# Patient Record
Sex: Male | Born: 1990 | Hispanic: Yes | Marital: Single | Smoking: Never smoker
Health system: Southern US, Community
[De-identification: ages and names within clinical notes are randomized; demographics above are authoritative.]

## PROBLEM LIST (undated history)

## (undated) DIAGNOSIS — K219 Gastro-esophageal reflux disease without esophagitis: Secondary | ICD-10-CM

## (undated) DIAGNOSIS — E079 Disorder of thyroid, unspecified: Secondary | ICD-10-CM

## (undated) HISTORY — DX: Gastro-esophageal reflux disease without esophagitis: K21.9

## (undated) HISTORY — DX: Disorder of thyroid, unspecified: E07.9

## (undated) HISTORY — PX: ESOPHAGOGASTRODUODENOSCOPY: SHX1529

---

## 2012-07-22 DIAGNOSIS — L709 Acne, unspecified: Secondary | ICD-10-CM | POA: Insufficient documentation

## 2013-03-23 ENCOUNTER — Ambulatory Visit: Payer: Self-pay | Admitting: Endocrinology

## 2013-08-02 ENCOUNTER — Ambulatory Visit: Payer: Self-pay | Admitting: Endocrinology

## 2013-09-05 ENCOUNTER — Ambulatory Visit (INDEPENDENT_AMBULATORY_CARE_PROVIDER_SITE_OTHER): Payer: Self-pay | Admitting: Endocrinology

## 2013-09-05 ENCOUNTER — Encounter: Payer: Self-pay | Admitting: Endocrinology

## 2013-09-05 ENCOUNTER — Ambulatory Visit (INDEPENDENT_AMBULATORY_CARE_PROVIDER_SITE_OTHER): Payer: BC Managed Care – PPO | Admitting: Endocrinology

## 2013-09-05 VITALS — BP 92/50 | HR 60 | Temp 98.1°F | Resp 10 | Ht 64.0 in | Wt 110.3 lb

## 2013-09-05 DIAGNOSIS — E039 Hypothyroidism, unspecified: Secondary | ICD-10-CM | POA: Insufficient documentation

## 2013-09-05 LAB — TSH: TSH: 0.65 u[IU]/mL (ref 0.35–5.50)

## 2013-09-05 LAB — T4, FREE: Free T4: 0.99 ng/dL (ref 0.60–1.60)

## 2013-09-05 NOTE — Patient Instructions (Signed)
blood tests are being requested for you today.  We'll contact you with results.  

## 2013-09-05 NOTE — Progress Notes (Signed)
  Subjective:    Patient ID: Craig Price, male    DOB: 03/22/1991, 22 y.o.   MRN: [id number]  HPI In December of 2013, pt was noted on routine labs to have abnormal TSH (16).  He was rx'ed synthroid 25 mcg/day, and has been on this dosage ever since then.  He has moderate cold intolerance throughout the body, and assoc fatigue.   No past medical history on file.  No past surgical history on file.  History   Social History  . Marital Status: Single    Spouse Name: N/A    Number of Children: N/A  . Years of Education: N/A   Occupational History  . Not on file.   Social History Main Topics  . Smoking status: Not on file  . Smokeless tobacco: Not on file  . Alcohol Use: Not on file  . Drug Use: Not on file  . Sexual Activity: Not on file   Other Topics Concern  . Not on file   Social History Narrative  . No narrative on file    No current outpatient prescriptions on file prior to visit.   No current facility-administered medications on file prior to visit.    Allergies not on file  No family history on file. No thyroid problems There were no vitals taken for this visit.  Review of Systems denies depression, hair loss, cramps, sob, weight gain, numbness, myalgias, dry skin, rhinorrhea, easy bruising, and syncope.  He has constipation, acne, and blurry vision.  In 2010, he had a 1 cm area of hair loss at the back of the head.  It resolved without rx.      Objective:   Physical Exam VS: see vs page GEN: no distress HEAD: head: no deformity eyes: no periorbital swelling, no proptosis external nose and ears are normal mouth: no lesion seen NECK: supple, thyroid is not enlarged CHEST WALL: no deformity LUNGS: clear to auscultation BREASTS:  No gynecomastia CV: reg rate and rhythm, no murmur ABD: abdomen is soft, nontender.  no hepatosplenomegaly.  not distended.  no hernia MUSCULOSKELETAL: muscle bulk and strength are grossly normal.  no obvious joint swelling.   gait is normal and steady EXTEMITIES: no deformity. no edema PULSES: dorsalis pedis intact bilat.  no carotid bruit NEURO:  cn 2-12 grossly intact.   readily moves all 4's.  sensation is intact to touch on all 4's.  No tremor SKIN:  Normal texture and temperature.  No rash or suspicious lesion is visible.   NODES:  None palpable at the neck PSYCH: alert, oriented x3.  Does not appear anxious nor depressed.  Lab Results  Component Value Date   TSH 0.65 09/05/2013      Assessment & Plan:  Chronic hypothyroidism, well-replaced Alopecia arreata, prob autoimmune, resolved without rx. Cold intolerance, and other sxs, not thyroid-related.

## 2013-09-12 DIAGNOSIS — E039 Hypothyroidism, unspecified: Secondary | ICD-10-CM | POA: Insufficient documentation

## 2013-09-12 NOTE — Progress Notes (Signed)
   Subjective:    Patient ID: Craig Price, male    DOB: 12-Dec-1990, 22 y.o.   MRN: [id number]  HPI    Review of Systems     Objective:   Physical Exam        Assessment & Plan:

## 2015-11-20 ENCOUNTER — Other Ambulatory Visit: Payer: Self-pay | Admitting: Gastroenterology

## 2015-11-20 DIAGNOSIS — IMO0001 Reserved for inherently not codable concepts without codable children: Secondary | ICD-10-CM

## 2015-11-20 DIAGNOSIS — R1310 Dysphagia, unspecified: Secondary | ICD-10-CM

## 2015-11-20 DIAGNOSIS — K219 Gastro-esophageal reflux disease without esophagitis: Principal | ICD-10-CM

## 2015-11-26 ENCOUNTER — Ambulatory Visit
Admission: RE | Admit: 2015-11-26 | Discharge: 2015-11-26 | Disposition: A | Discharge status: Home / Self Care | Payer: Managed Care, Other (non HMO) | Source: Ambulatory Visit | Attending: Gastroenterology | Admitting: Gastroenterology

## 2015-11-26 ENCOUNTER — Other Ambulatory Visit: Payer: Self-pay

## 2015-11-26 DIAGNOSIS — K219 Gastro-esophageal reflux disease without esophagitis: Principal | ICD-10-CM

## 2015-11-26 DIAGNOSIS — R1310 Dysphagia, unspecified: Secondary | ICD-10-CM

## 2015-11-26 DIAGNOSIS — IMO0001 Reserved for inherently not codable concepts without codable children: Secondary | ICD-10-CM

## 2016-01-31 ENCOUNTER — Other Ambulatory Visit: Payer: Self-pay | Admitting: Internal Medicine

## 2016-01-31 DIAGNOSIS — R51 Headache: Principal | ICD-10-CM

## 2016-01-31 DIAGNOSIS — R519 Headache, unspecified: Secondary | ICD-10-CM

## 2016-02-01 ENCOUNTER — Ambulatory Visit
Admission: RE | Admit: 2016-02-01 | Discharge: 2016-02-01 | Disposition: A | Discharge status: Home / Self Care | Payer: Managed Care, Other (non HMO) | Source: Ambulatory Visit | Attending: Internal Medicine | Admitting: Internal Medicine

## 2016-02-01 DIAGNOSIS — R51 Headache: Principal | ICD-10-CM

## 2016-02-01 DIAGNOSIS — R519 Headache, unspecified: Secondary | ICD-10-CM

## 2016-02-06 ENCOUNTER — Other Ambulatory Visit: Payer: Managed Care, Other (non HMO)

## 2016-03-04 DIAGNOSIS — K219 Gastro-esophageal reflux disease without esophagitis: Secondary | ICD-10-CM | POA: Insufficient documentation

## 2016-03-04 DIAGNOSIS — R490 Dysphonia: Secondary | ICD-10-CM | POA: Insufficient documentation

## 2016-03-04 DIAGNOSIS — R059 Cough, unspecified: Secondary | ICD-10-CM | POA: Insufficient documentation

## 2016-08-07 DIAGNOSIS — J029 Acute pharyngitis, unspecified: Secondary | ICD-10-CM | POA: Insufficient documentation

## 2016-08-07 DIAGNOSIS — H9203 Otalgia, bilateral: Secondary | ICD-10-CM | POA: Insufficient documentation

## 2016-08-19 ENCOUNTER — Other Ambulatory Visit: Payer: Self-pay | Admitting: Internal Medicine

## 2016-08-19 DIAGNOSIS — R490 Dysphonia: Secondary | ICD-10-CM

## 2016-08-19 DIAGNOSIS — M542 Cervicalgia: Secondary | ICD-10-CM

## 2016-08-21 ENCOUNTER — Other Ambulatory Visit: Payer: Managed Care, Other (non HMO)

## 2016-08-26 ENCOUNTER — Inpatient Hospital Stay: Admission: RE | Admit: 2016-08-26 | Payer: Managed Care, Other (non HMO) | Source: Ambulatory Visit

## 2016-09-12 ENCOUNTER — Other Ambulatory Visit: Payer: Managed Care, Other (non HMO)

## 2016-10-17 ENCOUNTER — Ambulatory Visit
Admission: RE | Admit: 2016-10-17 | Discharge: 2016-10-17 | Disposition: A | Discharge status: Home / Self Care | Payer: Managed Care, Other (non HMO) | Source: Ambulatory Visit | Attending: Internal Medicine | Admitting: Internal Medicine

## 2016-10-17 DIAGNOSIS — R490 Dysphonia: Secondary | ICD-10-CM

## 2016-10-17 DIAGNOSIS — M542 Cervicalgia: Secondary | ICD-10-CM

## 2016-10-17 MED ORDER — IOPAMIDOL (ISOVUE-300) INJECTION 61%
75.0000 mL | Freq: Once | INTRAVENOUS | Status: AC | PRN
  Administered 2016-10-17: 75 mL via INTRAVENOUS

## 2017-11-27 ENCOUNTER — Other Ambulatory Visit: Payer: Self-pay | Admitting: Gastroenterology

## 2017-11-27 DIAGNOSIS — R634 Abnormal weight loss: Secondary | ICD-10-CM

## 2017-11-27 DIAGNOSIS — R1012 Left upper quadrant pain: Secondary | ICD-10-CM

## 2017-12-18 ENCOUNTER — Ambulatory Visit
Admission: RE | Admit: 2017-12-18 | Discharge: 2017-12-18 | Disposition: A | Discharge status: Home / Self Care | Payer: Managed Care, Other (non HMO) | Source: Ambulatory Visit | Attending: Gastroenterology | Admitting: Gastroenterology

## 2017-12-18 DIAGNOSIS — R1012 Left upper quadrant pain: Secondary | ICD-10-CM

## 2017-12-18 DIAGNOSIS — R634 Abnormal weight loss: Secondary | ICD-10-CM

## 2017-12-18 IMAGING — CT CT ABD-PELV W/ CM
1 of 2 series · 14 of 32 positions shown, 19 images · IV contrast (APPLIED)
Comparison: None.

CLINICAL DATA: Left upper quadrant pain for 2 months.  Weight loss.

EXAM:
CT ABDOMEN AND PELVIS WITH CONTRAST
TECHNIQUE: Multidetector CT imaging of the abdomen and pelvis was performed
using the standard protocol following bolus administration of
intravenous contrast.
CONTRAST:  100mL [BC] IOPAMIDOL ([BC]) INJECTION 61%

[Series 2: abd/pelvis w/cm · axial · 0.66mm/px · z∈[+607,+997]mm · 14 of 88 slices shown, 19 images]
[im 5/88  soft-tissue]
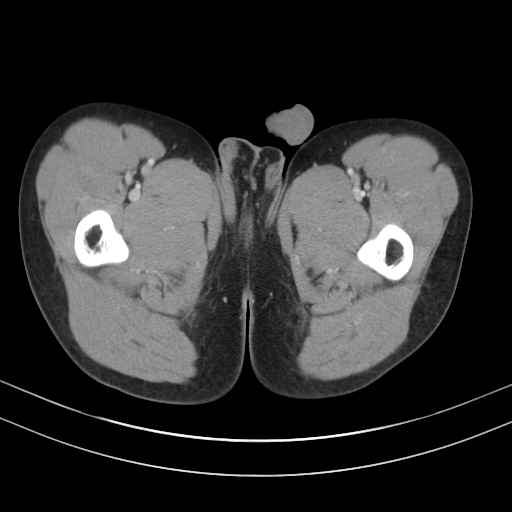
[im 5/88  bone]
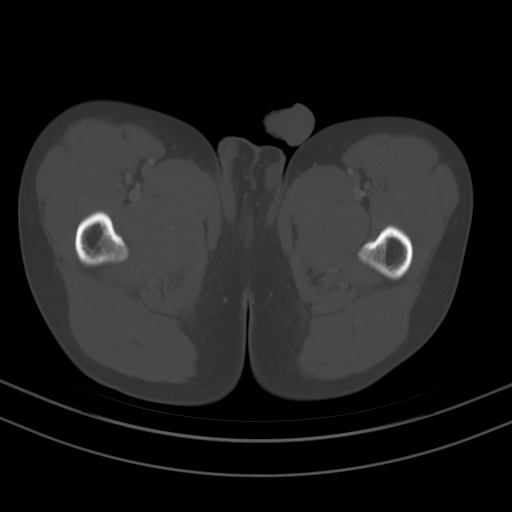
[im 14/88  soft-tissue]
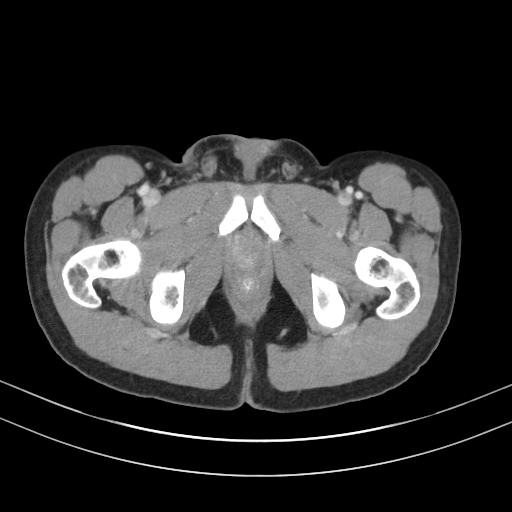
[im 19/88  soft-tissue]
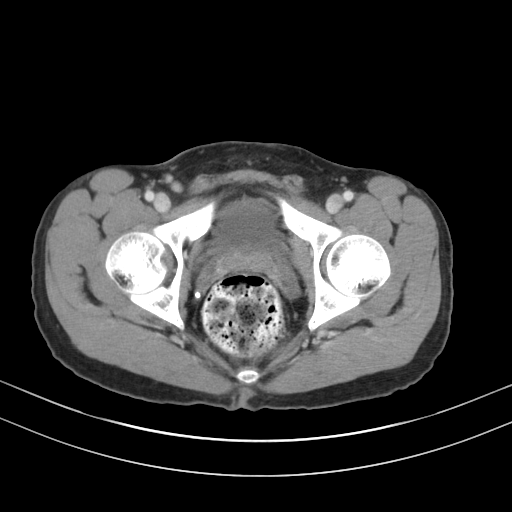
[im 23/88  soft-tissue]
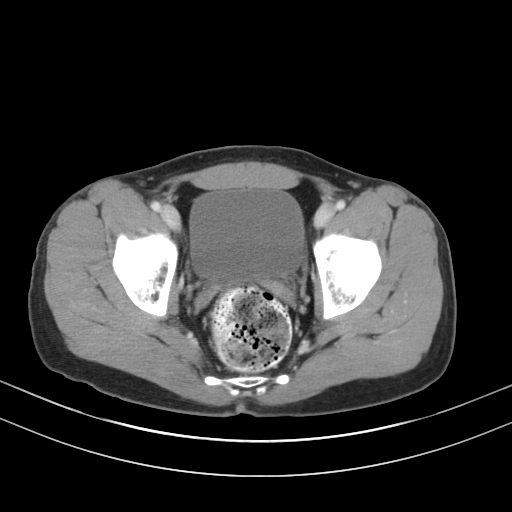
[im 33/88  soft-tissue]
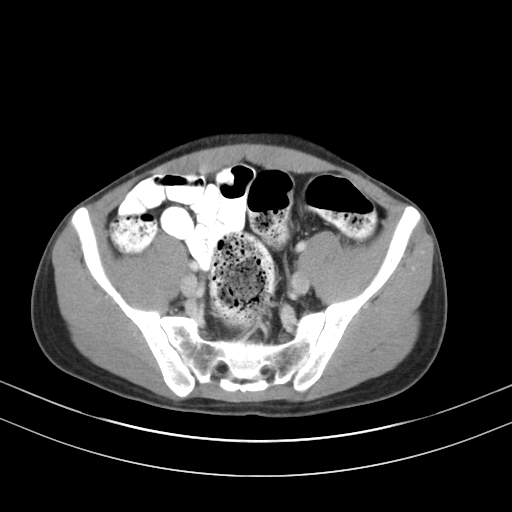
[im 37/88  soft-tissue]
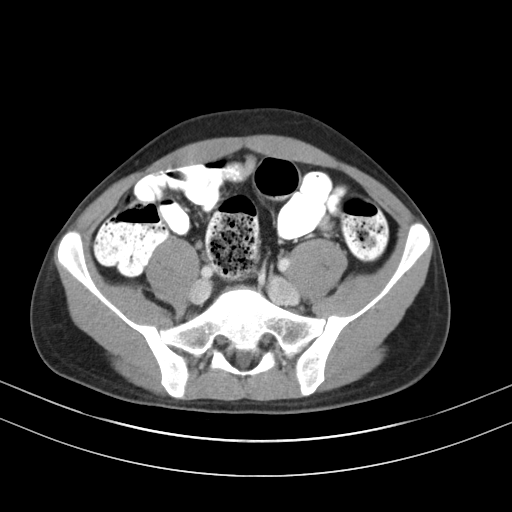
[im 46/88  soft-tissue]
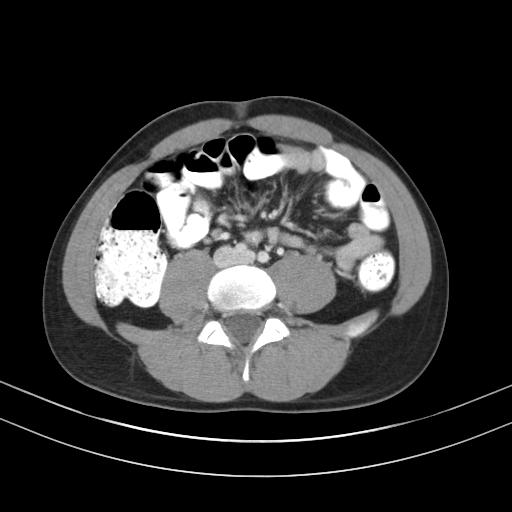
[im 51/88  soft-tissue]
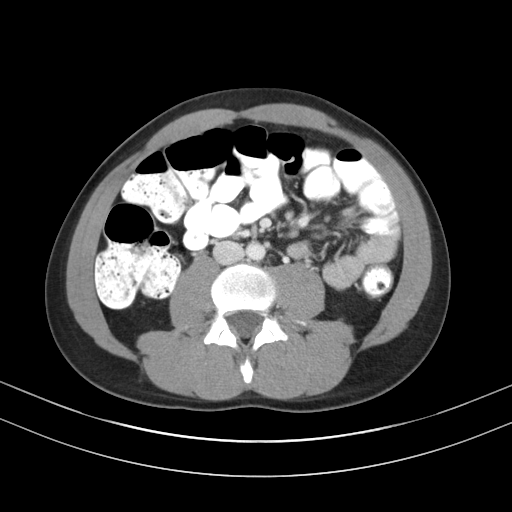
[im 55/88  soft-tissue]
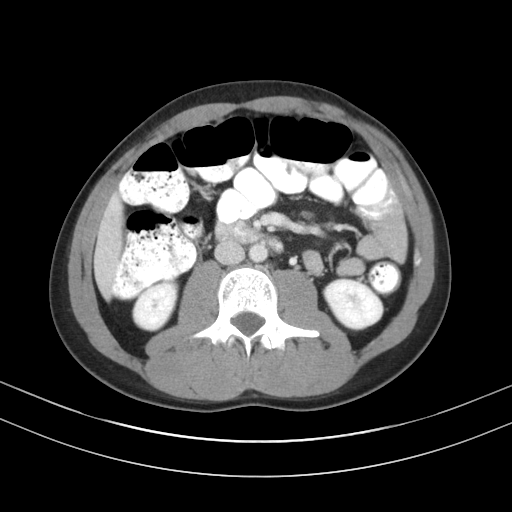
[im 55/88  bone]
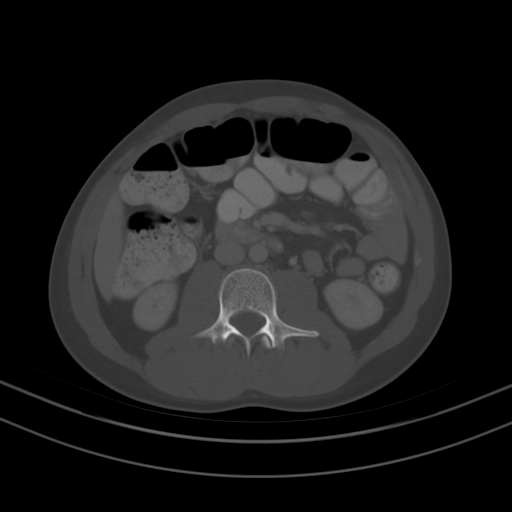
[im 65/88  soft-tissue]
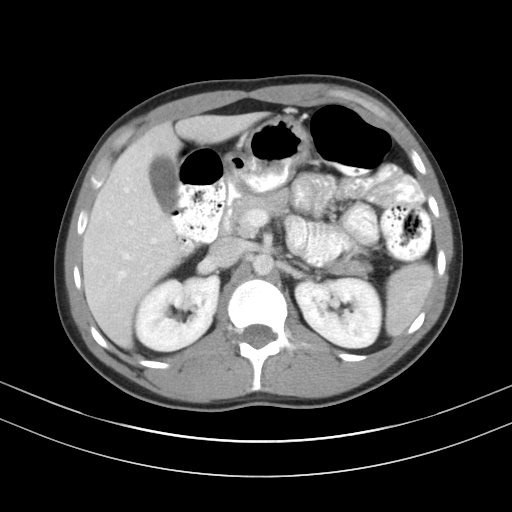
[im 69/88  soft-tissue]
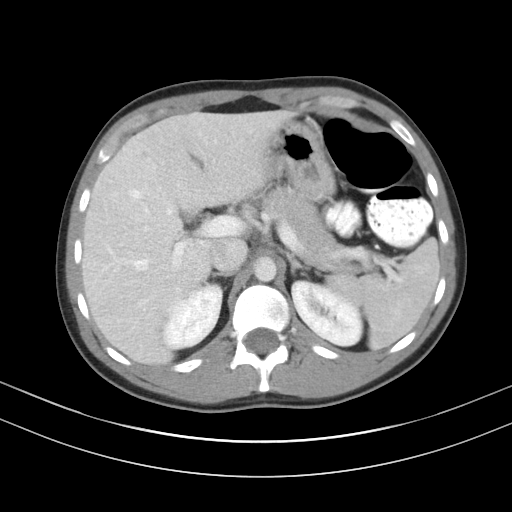
[im 69/88  lung]
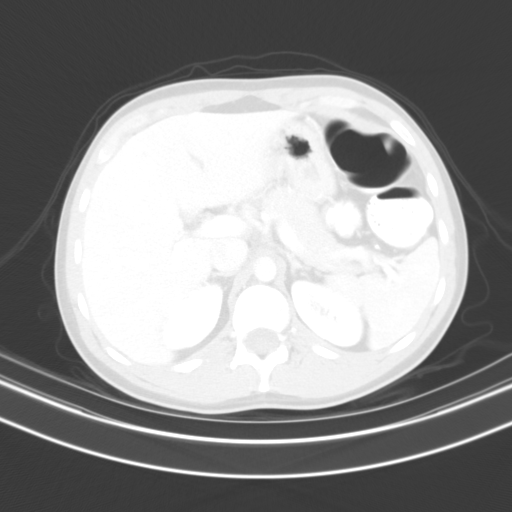
[im 74/88  soft-tissue]
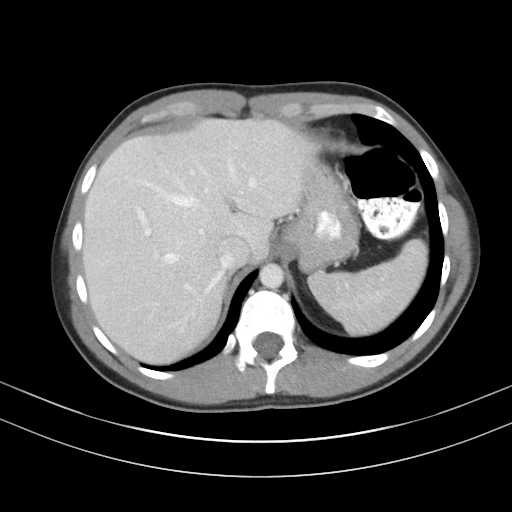
[im 74/88  lung]
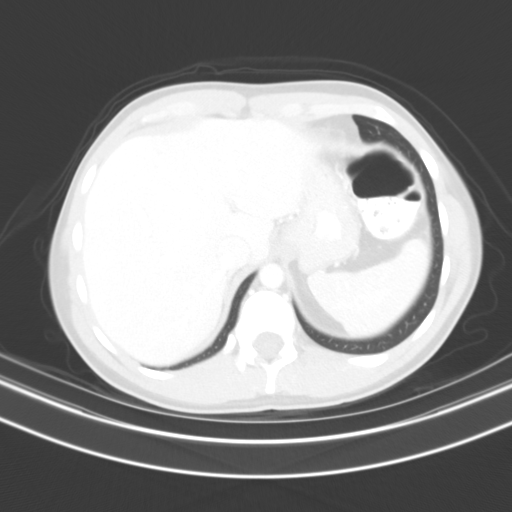
[im 78/88  lung]
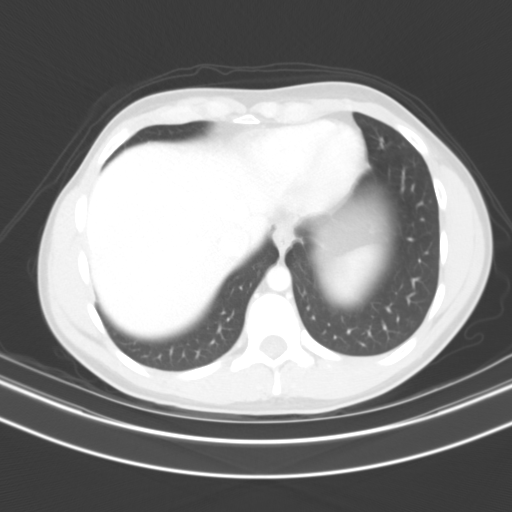
[im 83/88  soft-tissue]
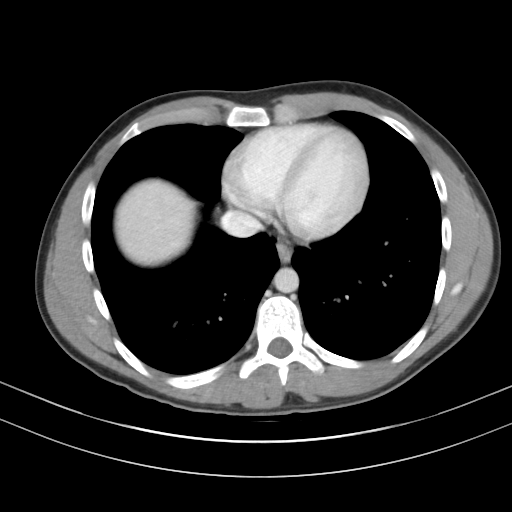
[im 83/88  lung]
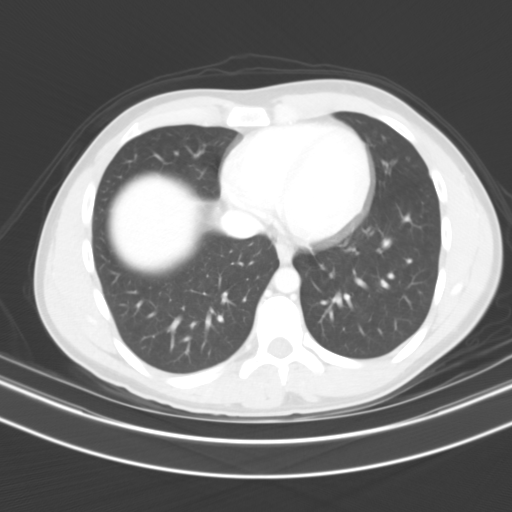

[14 of 32 positions shown; findings below may reference images not displayed]

FINDINGS: Lower Chest: No acute findings.

Hepatobiliary: No hepatic masses identified. Gallbladder is
unremarkable.

Pancreas:  No mass or inflammatory changes.

Spleen: Within normal limits in size and appearance.

Adrenals/Urinary Tract: No masses identified. No evidence of
hydronephrosis. Unremarkable unopacified urinary bladder.

Stomach/Bowel: No evidence of obstruction, inflammatory process or
abnormal fluid collections. Normal appendix visualized. Large amount
of stool seen throughout the colon.

Vascular/Lymphatic: No pathologically enlarged lymph nodes. No
abdominal aortic aneurysm.

Reproductive: Rim enhancing low-attenuation lesion is seen in the
left apical peripheral zone of the prostate gland, measuring
approximately 1.5 cm. In a patient of this age, this raises
suspicion for prostatitis with small prostatic abscess.

Other:  None.

Musculoskeletal:  No suspicious bone lesions identified.
IMPRESSION: Small peripherally enhancing lesion in the prostate, suspicious for
prostatitis with small prostatic abscess in patient of this age.
Consider correlation with PSA; prostate MRI without and with
contrast could also be performed for further evaluation.

Large stool burden noted; recommend clinical correlation for
possible constipation.

## 2017-12-18 MED ORDER — IOPAMIDOL (ISOVUE-300) INJECTION 61%
100.0000 mL | Freq: Once | INTRAVENOUS | Status: AC | PRN
  Administered 2017-12-18: 100 mL via INTRAVENOUS

## 2018-01-01 ENCOUNTER — Other Ambulatory Visit: Payer: Self-pay | Admitting: Urology

## 2018-01-01 DIAGNOSIS — N410 Acute prostatitis: Secondary | ICD-10-CM

## 2018-01-01 DIAGNOSIS — N4283 Cyst of prostate: Secondary | ICD-10-CM

## 2018-01-27 ENCOUNTER — Ambulatory Visit (HOSPITAL_COMMUNITY): Payer: Managed Care, Other (non HMO)

## 2018-01-29 ENCOUNTER — Ambulatory Visit (HOSPITAL_COMMUNITY): Payer: Managed Care, Other (non HMO)

## 2019-02-07 DIAGNOSIS — R634 Abnormal weight loss: Secondary | ICD-10-CM | POA: Diagnosis not present

## 2019-02-07 DIAGNOSIS — R1012 Left upper quadrant pain: Secondary | ICD-10-CM | POA: Diagnosis not present

## 2019-02-07 DIAGNOSIS — K219 Gastro-esophageal reflux disease without esophagitis: Secondary | ICD-10-CM | POA: Diagnosis not present

## 2019-03-28 DIAGNOSIS — K269 Duodenal ulcer, unspecified as acute or chronic, without hemorrhage or perforation: Secondary | ICD-10-CM | POA: Diagnosis not present

## 2019-03-28 DIAGNOSIS — R1013 Epigastric pain: Secondary | ICD-10-CM | POA: Diagnosis not present

## 2019-03-28 DIAGNOSIS — K2970 Gastritis, unspecified, without bleeding: Secondary | ICD-10-CM | POA: Diagnosis not present

## 2019-03-28 DIAGNOSIS — K2980 Duodenitis without bleeding: Secondary | ICD-10-CM | POA: Diagnosis not present

## 2019-03-28 DIAGNOSIS — K2930 Chronic superficial gastritis without bleeding: Secondary | ICD-10-CM | POA: Diagnosis not present

## 2019-05-02 DIAGNOSIS — E038 Other specified hypothyroidism: Secondary | ICD-10-CM | POA: Diagnosis not present

## 2019-05-02 DIAGNOSIS — R634 Abnormal weight loss: Secondary | ICD-10-CM | POA: Diagnosis not present

## 2019-05-02 DIAGNOSIS — K219 Gastro-esophageal reflux disease without esophagitis: Secondary | ICD-10-CM | POA: Diagnosis not present

## 2019-05-02 DIAGNOSIS — E559 Vitamin D deficiency, unspecified: Secondary | ICD-10-CM | POA: Diagnosis not present

## 2019-05-02 DIAGNOSIS — E069 Thyroiditis, unspecified: Secondary | ICD-10-CM | POA: Diagnosis not present

## 2019-05-02 DIAGNOSIS — R5383 Other fatigue: Secondary | ICD-10-CM | POA: Diagnosis not present

## 2019-06-24 DIAGNOSIS — R82998 Other abnormal findings in urine: Secondary | ICD-10-CM | POA: Diagnosis not present

## 2019-06-24 DIAGNOSIS — E039 Hypothyroidism, unspecified: Secondary | ICD-10-CM | POA: Diagnosis not present

## 2019-06-24 DIAGNOSIS — Z0000 Encounter for general adult medical examination without abnormal findings: Secondary | ICD-10-CM | POA: Diagnosis not present

## 2019-06-24 DIAGNOSIS — E559 Vitamin D deficiency, unspecified: Secondary | ICD-10-CM | POA: Diagnosis not present

## 2019-06-28 DIAGNOSIS — E559 Vitamin D deficiency, unspecified: Secondary | ICD-10-CM | POA: Diagnosis not present

## 2019-06-28 DIAGNOSIS — M549 Dorsalgia, unspecified: Secondary | ICD-10-CM | POA: Diagnosis not present

## 2019-06-28 DIAGNOSIS — K219 Gastro-esophageal reflux disease without esophagitis: Secondary | ICD-10-CM | POA: Diagnosis not present

## 2019-06-28 DIAGNOSIS — K2980 Duodenitis without bleeding: Secondary | ICD-10-CM | POA: Diagnosis not present

## 2019-06-28 DIAGNOSIS — Z0000 Encounter for general adult medical examination without abnormal findings: Secondary | ICD-10-CM | POA: Diagnosis not present

## 2019-06-28 DIAGNOSIS — K2970 Gastritis, unspecified, without bleeding: Secondary | ICD-10-CM | POA: Diagnosis not present

## 2019-10-11 DIAGNOSIS — L700 Acne vulgaris: Secondary | ICD-10-CM | POA: Diagnosis not present

## 2019-10-11 DIAGNOSIS — L720 Epidermal cyst: Secondary | ICD-10-CM | POA: Diagnosis not present

## 2019-11-21 DIAGNOSIS — K219 Gastro-esophageal reflux disease without esophagitis: Secondary | ICD-10-CM | POA: Diagnosis not present

## 2020-01-05 DIAGNOSIS — L700 Acne vulgaris: Secondary | ICD-10-CM | POA: Diagnosis not present

## 2020-01-05 DIAGNOSIS — L738 Other specified follicular disorders: Secondary | ICD-10-CM | POA: Diagnosis not present

## 2020-07-06 DIAGNOSIS — E559 Vitamin D deficiency, unspecified: Secondary | ICD-10-CM | POA: Diagnosis not present

## 2020-07-06 DIAGNOSIS — Z0000 Encounter for general adult medical examination without abnormal findings: Secondary | ICD-10-CM | POA: Diagnosis not present

## 2020-07-06 DIAGNOSIS — E039 Hypothyroidism, unspecified: Secondary | ICD-10-CM | POA: Diagnosis not present

## 2020-07-13 DIAGNOSIS — Z0000 Encounter for general adult medical examination without abnormal findings: Secondary | ICD-10-CM | POA: Diagnosis not present

## 2020-07-13 DIAGNOSIS — Z7189 Other specified counseling: Secondary | ICD-10-CM | POA: Diagnosis not present

## 2020-07-13 DIAGNOSIS — R82998 Other abnormal findings in urine: Secondary | ICD-10-CM | POA: Diagnosis not present

## 2020-11-12 DIAGNOSIS — L700 Acne vulgaris: Secondary | ICD-10-CM | POA: Diagnosis not present

## 2020-11-12 DIAGNOSIS — L720 Epidermal cyst: Secondary | ICD-10-CM | POA: Diagnosis not present

## 2020-11-23 DIAGNOSIS — L720 Epidermal cyst: Secondary | ICD-10-CM | POA: Diagnosis not present

## 2020-11-23 DIAGNOSIS — L700 Acne vulgaris: Secondary | ICD-10-CM | POA: Diagnosis not present

## 2021-02-11 DIAGNOSIS — L658 Other specified nonscarring hair loss: Secondary | ICD-10-CM | POA: Diagnosis not present

## 2021-02-11 DIAGNOSIS — L700 Acne vulgaris: Secondary | ICD-10-CM | POA: Diagnosis not present

## 2021-07-26 DIAGNOSIS — E039 Hypothyroidism, unspecified: Secondary | ICD-10-CM | POA: Diagnosis not present

## 2021-07-26 DIAGNOSIS — E559 Vitamin D deficiency, unspecified: Secondary | ICD-10-CM | POA: Diagnosis not present

## 2021-08-01 DIAGNOSIS — Z1339 Encounter for screening examination for other mental health and behavioral disorders: Secondary | ICD-10-CM | POA: Diagnosis not present

## 2021-08-01 DIAGNOSIS — E039 Hypothyroidism, unspecified: Secondary | ICD-10-CM | POA: Diagnosis not present

## 2021-08-01 DIAGNOSIS — Z1331 Encounter for screening for depression: Secondary | ICD-10-CM | POA: Diagnosis not present

## 2021-08-01 DIAGNOSIS — Z0000 Encounter for general adult medical examination without abnormal findings: Secondary | ICD-10-CM | POA: Diagnosis not present

## 2022-03-03 DIAGNOSIS — E039 Hypothyroidism, unspecified: Secondary | ICD-10-CM | POA: Diagnosis not present

## 2022-03-03 DIAGNOSIS — F524 Premature ejaculation: Secondary | ICD-10-CM | POA: Diagnosis not present

## 2022-03-03 DIAGNOSIS — L709 Acne, unspecified: Secondary | ICD-10-CM | POA: Diagnosis not present

## 2022-03-03 DIAGNOSIS — R350 Frequency of micturition: Secondary | ICD-10-CM | POA: Diagnosis not present

## 2022-03-03 DIAGNOSIS — Z113 Encounter for screening for infections with a predominantly sexual mode of transmission: Secondary | ICD-10-CM | POA: Diagnosis not present

## 2022-03-25 DIAGNOSIS — L700 Acne vulgaris: Secondary | ICD-10-CM | POA: Diagnosis not present

## 2022-03-25 DIAGNOSIS — L538 Other specified erythematous conditions: Secondary | ICD-10-CM | POA: Diagnosis not present

## 2022-03-25 DIAGNOSIS — L218 Other seborrheic dermatitis: Secondary | ICD-10-CM | POA: Diagnosis not present

## 2022-04-11 DIAGNOSIS — R3915 Urgency of urination: Secondary | ICD-10-CM | POA: Diagnosis not present

## 2022-04-11 DIAGNOSIS — R350 Frequency of micturition: Secondary | ICD-10-CM | POA: Diagnosis not present

## 2022-08-08 DIAGNOSIS — E559 Vitamin D deficiency, unspecified: Secondary | ICD-10-CM | POA: Diagnosis not present

## 2022-08-08 DIAGNOSIS — E039 Hypothyroidism, unspecified: Secondary | ICD-10-CM | POA: Diagnosis not present

## 2022-08-15 DIAGNOSIS — Z1339 Encounter for screening examination for other mental health and behavioral disorders: Secondary | ICD-10-CM | POA: Diagnosis not present

## 2022-08-15 DIAGNOSIS — Z0000 Encounter for general adult medical examination without abnormal findings: Secondary | ICD-10-CM | POA: Diagnosis not present

## 2022-08-15 DIAGNOSIS — Z1331 Encounter for screening for depression: Secondary | ICD-10-CM | POA: Diagnosis not present

## 2022-08-15 DIAGNOSIS — K219 Gastro-esophageal reflux disease without esophagitis: Secondary | ICD-10-CM | POA: Diagnosis not present

## 2022-08-15 DIAGNOSIS — R82998 Other abnormal findings in urine: Secondary | ICD-10-CM | POA: Diagnosis not present

## 2022-08-20 ENCOUNTER — Encounter: Payer: Self-pay | Admitting: Physician Assistant

## 2022-09-25 ENCOUNTER — Encounter: Payer: Self-pay | Admitting: Physician Assistant

## 2022-09-25 ENCOUNTER — Ambulatory Visit (INDEPENDENT_AMBULATORY_CARE_PROVIDER_SITE_OTHER): Payer: BC Managed Care – PPO | Admitting: Physician Assistant

## 2022-09-25 VITALS — BP 122/64 | HR 100 | Ht 64.0 in | Wt 133.2 lb

## 2022-09-25 DIAGNOSIS — K219 Gastro-esophageal reflux disease without esophagitis: Secondary | ICD-10-CM | POA: Diagnosis not present

## 2022-09-25 DIAGNOSIS — R194 Change in bowel habit: Secondary | ICD-10-CM | POA: Diagnosis not present

## 2022-09-25 NOTE — Progress Notes (Signed)
Chief Complaint: GERD  HPI:    Mr. Craig Price is a 31 year old Hispanic male with a past medical history as listed below, who was referred to me by Velna Hatchet, MD for a complaint of GERD.      11/18/2017 patient seen by otolaryngology for ear pain.  Scribes some morning hoarseness.  He was on a twice a day PPI.  They did a transnasal laryngoscopy which showed some slight erythema to both vocal cords.  He was recommended to follow GI.    12/18/2017 CT abdomen pelvis with contrast ordered by Dr. Lu Duffel God which showed a small peripherally enhancing lesion in the prostate suspicious for prostatitis with small prostate ptotic abscess and a large stool burden.    Today, the patient presents to clinic and tells me he has always had some trouble with reflux which seems to flare here and there.  It is controlled as long as he stays on his Pantoprazole 40 mg once a day and Famotidine 20 mg at night.  Tells me that when he is on the medicine things are good but he does have some throat clearing of phlegm and hoarseness regardless of being on it.  Describes that being off of the medicine he has ear pain and headaches.    Also discusses some change in bowel habits.  Tells me that sometimes to eat food and he is fine and other times he will eat the same food and have diarrhea.  This has been increasing in frequency lately not to the point that it is worrying him yet but just something that he has noticed.    Denies fever, chills, weight loss, blood in his stool, nausea, vomiting or abdominal pain.  Past Medical History:  Diagnosis Date   Thyroid disease     History reviewed. No pertinent surgical history.  Current Outpatient Medications  Medication Sig Dispense Refill   doxycycline (VIBRAMYCIN) 100 MG capsule Take 100 mg by mouth 2 (two) times daily.     levothyroxine (SYNTHROID, LEVOTHROID) 25 MCG tablet Take 25 mcg by mouth daily before breakfast.     levothyroxine (SYNTHROID, LEVOTHROID) 25 MCG tablet Take  25 mcg by mouth daily before breakfast.     pantoprazole (PROTONIX) 40 MG tablet Take 40 mg by mouth daily.     No current facility-administered medications for this visit.    Allergies as of 09/25/2022   (No Known Allergies)    Family History: Possible family history of stomach cancer in his GF  Social History   Socioeconomic History   Marital status: Single    Spouse name: Not on file   Number of children: Not on file   Years of education: Not on file   Highest education level: Not on file  Occupational History   Occupation: call center supervior  Tobacco Use   Smoking status: Never   Smokeless tobacco: Never  Substance and Sexual Activity   Alcohol use: Yes    Types: 1 drink(s) per week    Comment: only goes out about once a month   Drug use: No   Sexual activity: Yes    Partners: Female    Birth control/protection: Condom  Other Topics Concern   Not on file  Social History Narrative   Not on file   Social Determinants of Health   Financial Resource Strain: Not on file  Food Insecurity: Not on file  Transportation Needs: Not on file  Physical Activity: Not on file  Stress: Not on file  Social Connections: Not on file  Intimate Partner Violence: Not on file    Review of Systems:    Constitutional: No weight loss, fever or chills Skin: No rash  Cardiovascular: No chest pain Respiratory: No SOB  Gastrointestinal: See HPI and otherwise negative Genitourinary: No dysuria  Neurological: No headache, dizziness or syncope Musculoskeletal: No new muscle or joint pain Hematologic: No bleeding Psychiatric: No history of depression or anxiety   Physical Exam:  Vital signs: BP 122/64   Pulse 100   Ht 5\' 4"  (1.626 m)   Wt 133 lb 4 oz (60.4 kg)   BMI 22.87 kg/m    Constitutional:   Pleasant male appears to be in NAD, Well developed, Well nourished, alert and cooperative Head:  Normocephalic and atraumatic. Eyes:   PEERL, EOMI. No icterus. Conjunctiva  pink. Ears:  Normal auditory acuity. Neck:  Supple Throat: Oral cavity and pharynx without inflammation, swelling or lesion.  Respiratory: Respirations even and unlabored. Lungs clear to auscultation bilaterally.   No wheezes, crackles, or rhonchi.  Cardiovascular: Normal S1, S2. No MRG. Regular rate and rhythm. No peripheral edema, cyanosis or pallor.  Gastrointestinal:  Soft, nondistended, nontender. No rebound or guarding. Normal bowel sounds. No appreciable masses or hepatomegaly. Rectal:  Not performed.  Msk:  Symmetrical without gross deformities. Without edema, no deformity or joint abnormality.  Neurologic:  Alert and  oriented x4;  grossly normal neurologically.  Skin:   Dry and intact without significant lesions or rashes. Psychiatric: Demonstrates good judgement and reason without abnormal affect or behaviors.  No recent labs or imaging.  Assessment: 1.  GERD: Chronic for the patient, controlled on Pantoprazole 40 daily and Pepcid 20 mg nightly, no prior EGD 2.  Change in bowel habits: Has noticed some increased diarrhea lately after eating but not every day; consider relation to diet most likely versus IBS versus other  Plan: 1.  Recommend the patient start fiber supplement such as Benefiber or a fiber gummy and ensure that he is drinking enough water to help with change in bowel habits. 2.  Scheduled patient for diagnostic EGD in the [facility] with Dr. .  Patient requested a Friday.  Provided the patient with a detailed list risks for the procedure and he agrees to proceed. Patient is appropriate for endoscopic procedure(s) in the ambulatory ([facility]) setting.  3.  Patient to continue Pantoprazole 40 mg every morning and Pepcid 20 mg nightly.  He said he has enough of this medicine at home. 4.  Patient to follow in clinic per recommendations from Dr. Tuesday after time of procedure.  Barron Alvine, PA-C [facility] 09/25/2022, 2:38 PM  Cc: 09/27/2022,  MD

## 2022-09-25 NOTE — Patient Instructions (Addendum)
Continue protonix, famotidine   A high fiber diet with plenty of fluids (up to 8 glasses of water daily) is suggested to relieve these symptoms.  Metamucil, 1 tablespoon once or twice daily can be used to keep bowels regular if needed.   If you are age 32 or older, your body mass index should be between 23-30. Your Body mass index is 22.87 kg/m. If this is out of the aforementioned range listed, please consider follow up with your Primary Care Provider.  If you are age 53 or younger, your body mass index should be between 19-25. Your Body mass index is 22.87 kg/m. If this is out of the aformentioned range listed, please consider follow up with your Primary Care Provider.   ________________________________________________________  The [facility] providers would like to encourage you to use [facility] to communicate with providers for non-urgent requests or questions.  Due to long hold times on the telephone, sending your provider a message by [facility] may be a faster and more efficient way to get a response.  Please allow 48 business hours for a response.  Please remember that this is for non-urgent requests.  You have been scheduled for an endoscopy. Please follow written instructions given to you at your visit today. If you use inhalers (even only as needed), please bring them with you on the day of your procedure.   Due to recent changes in healthcare laws, you may see the results of your imaging and laboratory studies on MyChart before your provider has had a chance to review them.  We understand that in some cases there may be results that are confusing or concerning to you. Not all laboratory results come back in the same time frame and the provider may be waiting for multiple results in order to interpret others.  Please give [place] 48 hours in order for your provider to thoroughly review all the results before contacting the office for clarification of your results.    Thank you for entrusting me  with your care and choosing [facility].  Hyacinth Meeker PA-C

## 2022-10-17 ENCOUNTER — Ambulatory Visit (AMBULATORY_SURGERY_CENTER): Payer: BC Managed Care – PPO | Admitting: Gastroenterology

## 2022-10-17 ENCOUNTER — Encounter: Payer: Self-pay | Admitting: Gastroenterology

## 2022-10-17 VITALS — BP 114/65 | HR 67 | Temp 98.0°F | Resp 16 | Ht 64.0 in | Wt 133.0 lb

## 2022-10-17 DIAGNOSIS — K219 Gastro-esophageal reflux disease without esophagitis: Secondary | ICD-10-CM

## 2022-10-17 DIAGNOSIS — R12 Heartburn: Secondary | ICD-10-CM | POA: Diagnosis not present

## 2022-10-17 DIAGNOSIS — K269 Duodenal ulcer, unspecified as acute or chronic, without hemorrhage or perforation: Secondary | ICD-10-CM

## 2022-10-17 DIAGNOSIS — K2980 Duodenitis without bleeding: Secondary | ICD-10-CM | POA: Diagnosis not present

## 2022-10-17 DIAGNOSIS — R194 Change in bowel habit: Secondary | ICD-10-CM

## 2022-10-17 MED ORDER — SODIUM CHLORIDE 0.9 % IV SOLN: 500.0000 mL | Freq: Once | INTRAVENOUS | Status: DC

## 2022-10-17 NOTE — Progress Notes (Signed)
VS completed by DT.  Pt's states no medical or surgical changes since previsit or office visit.  

## 2022-10-17 NOTE — Progress Notes (Signed)
Report to PACU, RN, vss, BBS= Clear.  

## 2022-10-17 NOTE — Patient Instructions (Addendum)
- Patient has a contact number available for emergencies. The signs and symptoms of potential delayed complications were discussed with the patient. Return to normal activities tomorrow. Written discharge instructions were provided to the patient. - Resume previous diet. - Continue present medications. - Await pathology results. - Return to GI clinic at appointment to be scheduled. - Depending on pathology results, will plan on further small bowel interrogation with either Video Capsule Endoscopy or CT Enterography.   YOU HAD AN ENDOSCOPIC PROCEDURE TODAY AT [facility]:   Refer to the procedure report that was given to you for any specific questions about what was found during the examination.  If the procedure report does not answer your questions, please call your gastroenterologist to clarify.  If you requested that your care partner not be given the details of your procedure findings, then the procedure report has been included in a sealed envelope for you to review at your convenience later.  YOU SHOULD EXPECT: Some feelings of bloating in the abdomen. Passage of more gas than usual.  Walking can help get rid of the air that was put into your GI tract during the procedure and reduce the bloating. If you had a lower endoscopy (such as a colonoscopy or flexible sigmoidoscopy) you may notice spotting of blood in your stool or on the toilet paper. If you underwent a bowel prep for your procedure, you may not have a normal bowel movement for a few days.  Please Note:  You might notice some irritation and congestion in your nose or some drainage.  This is from the oxygen used during your procedure.  There is no need for concern and it should clear up in a day or so.  SYMPTOMS TO REPORT IMMEDIATELY:   Following upper endoscopy (EGD)  Vomiting of blood or coffee ground material  New chest pain or pain under the shoulder blades  Painful or persistently difficult  swallowing  New shortness of breath  Fever of 100F or higher  Black, tarry-looking stools  For urgent or emergent issues, a gastroenterologist can be reached at any hour by calling [phone]. Do not use MyChart messaging for urgent concerns.    DIET:  We do recommend a small meal at first, but then you may proceed to your regular diet.  Drink plenty of fluids but you should avoid alcoholic beverages for 24 hours.  ACTIVITY:  You should plan to take it easy for the rest of today and you should NOT DRIVE or use heavy machinery until tomorrow (because of the sedation medicines used during the test).    FOLLOW UP: Our staff will call the number listed on your records the next business day following your procedure.  We will call around 7:15- 8:00 am to check on you and address any questions or concerns that you may have regarding the information given to you following your procedure. If we do not reach you, we will leave a message.     If any biopsies were taken you will be contacted by phone or by letter within the next 1-3 weeks.  Please call us at [phone] if you have not heard about the biopsies in 3 weeks.    SIGNATURES/CONFIDENTIALITY: You and/or your care partner have signed paperwork which will be entered into your electronic medical record.  These signatures attest to the fact that that the information above on your After Visit Summary has been reviewed and is understood.  Full responsibility of the confidentiality  of this discharge information lies with you and/or your care-partner.

## 2022-10-17 NOTE — Progress Notes (Signed)
GASTROENTEROLOGY PROCEDURE H&P NOTE   Primary Care Physician: Velna Hatchet, MD    Reason for Procedure:   GERD, change in stools  Plan:    EGD  Patient is appropriate for endoscopic procedure(s) in the ambulatory ([place]) setting.  The nature of the procedure, as well as the risks, benefits, and alternatives were carefully and thoroughly reviewed with the patient. Ample time for discussion and questions allowed. The patient understood, was satisfied, and agreed to proceed.     HPI: Craig Price is a 32 y.o. male who presents for EGD for evaluation of GERD and change in stools.  Patient was most recently seen in the Gastroenterology Clinic on 09/25/2022.  No interval change in medical history since that appointment. Please refer to that note for full details regarding GI history and clinical presentation.   Past Medical History:  Diagnosis Date   Thyroid disease     History reviewed. No pertinent surgical history.  Prior to Admission medications   Medication Sig Start Date End Date Taking? Authorizing Provider  doxycycline (VIBRAMYCIN) 100 MG capsule Take 100 mg by mouth 2 (two) times daily. 09/11/22  Yes [provider]  famotidine (PEPCID) 20 MG tablet Take 20 mg by mouth 2 (two) times daily.   Yes [provider]  levothyroxine (SYNTHROID, LEVOTHROID) 25 MCG tablet Take 25 mcg by mouth daily before breakfast.   Yes [provider]  pantoprazole (PROTONIX) 40 MG tablet Take 40 mg by mouth daily.   Yes [provider]  levothyroxine (SYNTHROID, LEVOTHROID) 25 MCG tablet Take 25 mcg by mouth daily before breakfast.    [provider]    Current Outpatient Medications  Medication Sig Dispense Refill   doxycycline (VIBRAMYCIN) 100 MG capsule Take 100 mg by mouth 2 (two) times daily.     famotidine (PEPCID) 20 MG tablet Take 20 mg by mouth 2 (two) times daily.     levothyroxine (SYNTHROID, LEVOTHROID) 25 MCG tablet Take 25 mcg by  mouth daily before breakfast.     pantoprazole (PROTONIX) 40 MG tablet Take 40 mg by mouth daily.     levothyroxine (SYNTHROID, LEVOTHROID) 25 MCG tablet Take 25 mcg by mouth daily before breakfast.     Current Facility-Administered Medications  Medication Dose Route Frequency Provider Last Rate Last Admin   0.9 %  sodium chloride infusion  500 mL Intravenous Once Tamsen Reist V, DO        Allergies as of 10/17/2022   (No Known Allergies)    Family History  Problem Relation Age of Onset   Colon cancer Neg Hx    Esophageal cancer Neg Hx    Rectal cancer Neg Hx    Stomach cancer Neg Hx     Social History   Socioeconomic History   Marital status: Single    Spouse name: Not on file   Number of children: Not on file   Years of education: Not on file   Highest education level: Not on file  Occupational History   Occupation: call center supervior  Tobacco Use   Smoking status: Never   Smokeless tobacco: Never  Vaping Use   Vaping Use: Never used  Substance and Sexual Activity   Alcohol use: Yes    Types: 1 drink(s) per week    Comment: only goes out about once a month   Drug use: No   Sexual activity: Yes    Partners: Female    Birth control/protection: Condom  Other Topics Concern  Not on file  Social History Narrative   Not on file   Social Determinants of Health   Financial Resource Strain: Not on file  Food Insecurity: Not on file  Transportation Needs: Not on file  Physical Activity: Not on file  Stress: Not on file  Social Connections: Not on file  Intimate Partner Violence: Not on file    Physical Exam: Vital signs in last 24 hours: @BP  115/75   Pulse 63   Temp 98 F (36.7 C) (Temporal)   Resp 14   Ht 5\' 4"  (1.626 m)   Wt 133 lb (60.3 kg)   SpO2 100%   BMI 22.83 kg/m  GEN: NAD EYE: Sclerae anicteric ENT: MMM CV: Non-tachycardic Pulm: CTA b/l GI: Soft, NT/ND NEURO:  Alert & Oriented x 3   Gerrit Heck, DO [facility]   10/17/2022 10:15 AM

## 2022-10-17 NOTE — Op Note (Signed)
[facility] Patient Name: Craig Price Procedure Date: 10/17/2022 10:04 AM MRN: [id number] Endoscopist: Doristine Locks , MD, [id number] Age: 32 Referring MD:  Date of Birth: [birth year] Gender: Male Account #: [id number] Procedure:                Upper GI endoscopy Indications:              Heartburn, Suspected esophageal reflux,                            Diarrhea/Change in stools Medicines:                Monitored Anesthesia Care Procedure:                Pre-Anesthesia Assessment:                           - Prior to the procedure, a History and Physical                            was performed, and patient medications and                            allergies were reviewed. The patient's tolerance of                            previous anesthesia was also reviewed. The risks                            and benefits of the procedure and the sedation                            options and risks were discussed with the patient.                            All questions were answered, and informed consent                            was obtained. Prior Anticoagulants: The patient has                            taken no anticoagulant or antiplatelet agents. ASA                            Grade Assessment: II - A patient with mild systemic                            disease. After reviewing the risks and benefits,                            the patient was deemed in satisfactory condition to                            undergo the procedure.  After obtaining informed consent, the endoscope was                            passed under direct vision. Throughout the                            procedure, the patient's blood pressure, pulse, and                            oxygen saturations were monitored continuously. The                            GIF [id number] #[id number] was introduced through the                            mouth, and advanced to the fourth part of  duodenum.                            The upper GI endoscopy was accomplished without                            difficulty. The patient tolerated the procedure                            well. Scope In: Scope Out: Findings:                 The examined esophagus was normal.                           The Z-line was regular and was found 37 cm from the                            incisors.                           The gastroesophageal flap valve was visualized                            endoscopically and classified as Hill Grade II                            (fold present, opens with respiration).                           The entire examined stomach was normal.                           The duodenal bulb was normal.                           Multiple erosions without bleeding were found in                            the second portion of the duodenum, in the third  portion of the duodenum and in the fourth portion                            of the duodenum. Biopsies were taken with a cold                            forceps for histology. Estimated blood loss was                            minimal. Complications:            No immediate complications. Estimated Blood Loss:     Estimated blood loss was minimal. Impression:               - Normal esophagus.                           - Z-line regular, 37 cm from the incisors.                           - Gastroesophageal flap valve classified as Hill                            Grade II (fold present, opens with respiration).                           - Normal stomach.                           - Normal duodenal bulb.                           - Duodenal erosions without bleeding. Biopsied. Recommendation:           - Patient has a contact number available for                            emergencies. The signs and symptoms of potential                            delayed complications were discussed with the                             patient. Return to normal activities tomorrow.                            Written discharge instructions were provided to the                            patient.                           - Resume previous diet.                           - Continue present medications.                           -  Await pathology results.                           - Return to GI clinic at appointment to be                            scheduled.                           - Depending on pathology results, will plan on                            further small bowel interrogation with either Video                            Capsule Endoscopy or CT Enterography. Doristine Locks, MD 10/17/2022 10:46:59 AM

## 2022-10-17 NOTE — Progress Notes (Signed)
Called to room to assist during endoscopic procedure.  Patient ID and intended procedure confirmed with present staff. Received instructions for my participation in the procedure from the performing physician.  

## 2022-10-20 ENCOUNTER — Telehealth: Payer: Self-pay | Admitting: *Deleted

## 2022-10-20 NOTE — Telephone Encounter (Signed)
  Follow up Call-     10/17/2022    9:34 AM  Call back number  Post procedure Call Back phone  # [phone]  Permission to leave phone message Yes     Patient questions:  Message left to call us if necessary.

## 2022-10-24 ENCOUNTER — Telehealth: Payer: Self-pay | Admitting: Gastroenterology

## 2022-10-24 DIAGNOSIS — K269 Duodenal ulcer, unspecified as acute or chronic, without hemorrhage or perforation: Secondary | ICD-10-CM

## 2022-10-24 NOTE — Telephone Encounter (Signed)
Inbound call from patient requesting a call back to discuss results from EGD on 1/5. Please advise.

## 2022-10-24 NOTE — Telephone Encounter (Signed)
Pt calling for pathology results 

## 2022-10-24 NOTE — Telephone Encounter (Signed)
Spoke with pt and gave pt results and recommendations. Pt scheduled for f/u on 11/12/22 at 9 am. CT entero scheduled for 11/07/22 at 12:00 pm at [place].   How long does pt need be off acid suppression for gastrin?  He said he takes pantoprazole and pepcid.

## 2022-10-24 NOTE — Telephone Encounter (Signed)
Biopsy results reviewed and demonstrate severe inflammatory changes from the small intestine which goes along with the endoscopic findings of multiple erosions in the small bowel.  The uninvolved, normal-appearing mucosa otherwise did not have any features of Celiac Disease.  Potential causes for these findings include medications, upper tract Crohn's Disease, or infection.  Plan for the following:  - Check fasting gastrin level.  Please ensure he is not taking any acid suppression therapy prior to checking this lab - CT enterography to evaluate the remainder of the small bowel - Depending on lab and imaging, I have a low threshold to proceed with colonoscopy.  Please schedule OV with me to discuss results in further detail and discuss next steps in his evaluation

## 2022-10-27 ENCOUNTER — Other Ambulatory Visit: Payer: Self-pay

## 2022-10-27 DIAGNOSIS — K269 Duodenal ulcer, unspecified as acute or chronic, without hemorrhage or perforation: Secondary | ICD-10-CM

## 2022-10-27 DIAGNOSIS — K219 Gastro-esophageal reflux disease without esophagitis: Secondary | ICD-10-CM

## 2022-10-27 DIAGNOSIS — R194 Change in bowel habit: Secondary | ICD-10-CM

## 2022-10-27 NOTE — Telephone Encounter (Signed)
Left message for pt to call back.

## 2022-10-27 NOTE — Telephone Encounter (Signed)
Spoke with pt and gave him recommendations. Pt aware he needs to be off PPI for 10 days prior to fasting serum gastrin and plans to get lab done on 1/26 before CT scan. Also gave pt instructions for CT enterography and pt verbalized understanding. Gave pt number to radiology scheduling in case he needs to reschedule.

## 2022-11-05 NOTE — Telephone Encounter (Signed)
Received call from [facility] from [facility] stating that pt wanted to move CT scan to [facility] imaging. Pt stated that his insurance told him that it would be a lot cheaper to have CT done at [place]. CT canceled at [facility] and order changed to [facility] imaging. Called [place] imaging to schedule CT entero, the soonest available appt in [place] was Feb 15th. [place] location had February 1st. Gave pt number to [place] so he can schedule CT when it would fit his schedule.

## 2022-11-05 NOTE — Addendum Note (Signed)
Addended by: Marice Potter on: 11/05/2022 03:36 PM   Modules accepted: Orders

## 2022-11-07 ENCOUNTER — Ambulatory Visit (HOSPITAL_COMMUNITY): Payer: BC Managed Care – PPO

## 2022-11-07 ENCOUNTER — Other Ambulatory Visit: Payer: BC Managed Care – PPO

## 2022-11-07 DIAGNOSIS — K219 Gastro-esophageal reflux disease without esophagitis: Secondary | ICD-10-CM | POA: Diagnosis not present

## 2022-11-07 DIAGNOSIS — K269 Duodenal ulcer, unspecified as acute or chronic, without hemorrhage or perforation: Secondary | ICD-10-CM

## 2022-11-07 DIAGNOSIS — R194 Change in bowel habit: Secondary | ICD-10-CM | POA: Diagnosis not present

## 2022-11-12 ENCOUNTER — Encounter: Payer: Self-pay | Admitting: Gastroenterology

## 2022-11-12 ENCOUNTER — Ambulatory Visit (INDEPENDENT_AMBULATORY_CARE_PROVIDER_SITE_OTHER): Payer: BC Managed Care – PPO | Admitting: Gastroenterology

## 2022-11-12 VITALS — BP 100/60 | HR 72 | Ht 64.0 in | Wt 138.0 lb

## 2022-11-12 DIAGNOSIS — K219 Gastro-esophageal reflux disease without esophagitis: Secondary | ICD-10-CM

## 2022-11-12 DIAGNOSIS — R0989 Other specified symptoms and signs involving the circulatory and respiratory systems: Secondary | ICD-10-CM | POA: Diagnosis not present

## 2022-11-12 DIAGNOSIS — K269 Duodenal ulcer, unspecified as acute or chronic, without hemorrhage or perforation: Secondary | ICD-10-CM

## 2022-11-12 DIAGNOSIS — R194 Change in bowel habit: Secondary | ICD-10-CM | POA: Diagnosis not present

## 2022-11-12 LAB — GASTRIN: Gastrin: 18 pg/mL (ref <=100)

## 2022-11-12 NOTE — Progress Notes (Signed)
Chief Complaint:    GERD, change in bowel habits  GI History: 32 year old male initially seen in the GI clinic in 09/2022 for evaluation of reflux.  - 11/18/2017: Patient seen by otolaryngology for ear pain.  Scribes some morning hoarseness.  He was on a twice a day PPI.  They did a transnasal laryngoscopy which showed some slight erythema to both vocal cords.  He was recommended to follow GI. - 05/2018: CT abdomen pelvis with contrast: small peripherally enhancing lesion in the prostate suspicious for prostatitis with small prostate ptotic abscess and a large stool burden. - 09/25/2022: Initial evaluation in [place] [facility] clinic by Vicie Mutters for reflux.  Patient reports longstanding history of intermittent reflux symptoms with index symptoms throat clearing and hoarseness, but rare heartburn, regurgitation. Started pantoprazole 40 mg/day and famotidine 20 mg qhs, but still with throat clearing and hoarseness.  Ear pain and headaches tend to improve on therapy.  Separately, with changes in bowel habits described as episodic postprandial diarrhea.  Recommended fiber supplement along with EGD and continue pantoprazole/Pepcid. - 10/17/2022: EGD: Normal esophagus, stomach.  Hill grade 2 valve.  Multiple erosions in D2-D4 (path: Severe inflammatory changes).  Ordered fasting gastrin and CT enterography   HPI:     Patient is a 32 y.o. male presenting to the Gastroenterology Clinic for follow-up.  Initially seen on 09/25/2022 as above, and subsequent completed EGD (duodenal erosions), and ordered fasting gastrin (completed but not yet resulted) and CT enterography which is scheduled for next week.  PPI 1 week prior to fasting gastrin test.  He has since resumed PPI and continued Pepcid the entire time.  Still with throat clearing. No HB, regurgitation.   Still with alternating bowel habits. Tends to be post prandial urgency and loose stools, but unsure of specific food triggers.     Review of systems:      No chest pain, no SOB, no fevers, no urinary sx   Past Medical History:  Diagnosis Date   GERD (gastroesophageal reflux disease)    Thyroid disease     Patient's surgical history, family medical history, social history, medications and allergies were all reviewed in Epic    Current Outpatient Medications  Medication Sig Dispense Refill   doxycycline (VIBRAMYCIN) 100 MG capsule Take 100 mg by mouth 2 (two) times daily.     famotidine (PEPCID) 20 MG tablet Take 20 mg by mouth 2 (two) times daily.     levothyroxine (SYNTHROID, LEVOTHROID) 25 MCG tablet Take 25 mcg by mouth daily before breakfast.     pantoprazole (PROTONIX) 40 MG tablet Take 40 mg by mouth daily.     Current Facility-Administered Medications  Medication Dose Route Frequency Provider Last Rate Last Admin   0.9 %  sodium chloride infusion  500 mL Intravenous Once Senaida Chilcote V, DO        Physical Exam:     There were no vitals taken for this visit.  GENERAL:  Pleasant male in NAD PSYCH: : Cooperative, normal affect NEURO: Alert and oriented x 3, no focal neurologic deficits   IMPRESSION and PLAN:    1) Duodenal erosions Index EGD in 09/2022 with several erosions scattered through second-fourth portion of the duodenum.  Biopsies with severe inflammatory change, but no evidence of celiac disease.  We discussed the DDx to include small bowel Crohn's, Zollinger-Ellison syndrome today, with plan for the following:  - Will follow-up on pending fasting gastrin level.  If >1000, this would be highly suggestive of  ZES and plan for secretin stimulation test (needs to be off PPI), CT, and PET CT dotatate - Scheduled for CT enterography on 11/18/2022 to evaluate for additional small bowel inflammatory changes - If fasting gastrin normal and depending on CT enterography findings, also discussed colonoscopy to evaluate for Crohn's disease.  Will await those results first - Continue PPI as prescribed  2) Change in bowel  habits 3) Diarrhea - Evaluation as above - If unrevealing, plan for colonoscopy to evaluate for IBD  4) Throat clearing - Continuing Protonix and Pepcid for now - If above workup unrevealing, may need to consider pH/impedance testing off all acid suppression therapy  I spent 35 minutes of time, including in depth chart review, independent review of results as outlined above, communicating results with the patient directly, face-to-face time with the patient, coordinating care, and ordering studies and medications as appropriate, and documentation.            [place] ,DO, FACG 11/12/2022, 9:08 AM

## 2022-11-12 NOTE — Patient Instructions (Addendum)
You have been scheduled for an appointment with Dr. Bryan Lemma on 12/12/22 at 9:40 am. Please arrive 10 minutes early for your appointment.   ____________________________________________________  If your blood pressure at your visit was 140/90 or greater, please contact your primary care physician to follow up on this.  _______________________________________________________  If you are age 33 or younger, your body mass index should be between 19-25. Your Body mass index is 23.69 kg/m. If this is out of the aformentioned range listed, please consider follow up with your Primary Care Provider.   __________________________________________________________  The [place] [facility] providers would like to encourage you to use [facility] to communicate with providers for non-urgent requests or questions.  Due to long hold times on the telephone, sending your provider a message by [facility] may be a faster and more efficient way to get a response.  Please allow 48 business hours for a response.  Please remember that this is for non-urgent requests.   Due to recent changes in healthcare laws, you may see the results of your imaging and laboratory studies on MyChart before your provider has had a chance to review them.  We understand that in some cases there may be results that are confusing or concerning to you. Not all laboratory results come back in the same time frame and the provider may be waiting for multiple results in order to interpret others.  Please give [place] 48 hours in order for your provider to thoroughly review all the results before contacting the office for clarification of your results.     Thank you for choosing me and [facility].  Vito Cirigliano, D.O.

## 2022-11-18 ENCOUNTER — Inpatient Hospital Stay: Admission: RE | Admit: 2022-11-18 | Payer: BC Managed Care – PPO | Source: Ambulatory Visit

## 2022-12-02 ENCOUNTER — Ambulatory Visit
Admission: RE | Admit: 2022-12-02 | Discharge: 2022-12-02 | Disposition: A | Discharge status: Home / Self Care | Payer: BC Managed Care – PPO | Source: Ambulatory Visit | Attending: Gastroenterology | Admitting: Gastroenterology

## 2022-12-02 DIAGNOSIS — R194 Change in bowel habit: Secondary | ICD-10-CM | POA: Diagnosis not present

## 2022-12-02 DIAGNOSIS — K6389 Other specified diseases of intestine: Secondary | ICD-10-CM | POA: Diagnosis not present

## 2022-12-02 DIAGNOSIS — K269 Duodenal ulcer, unspecified as acute or chronic, without hemorrhage or perforation: Secondary | ICD-10-CM

## 2022-12-02 MED ORDER — IOPAMIDOL (ISOVUE-300) INJECTION 61%
100.0000 mL | Freq: Once | INTRAVENOUS | Status: AC | PRN
  Administered 2022-12-02: 100 mL via INTRAVENOUS

## 2022-12-12 ENCOUNTER — Ambulatory Visit (INDEPENDENT_AMBULATORY_CARE_PROVIDER_SITE_OTHER): Payer: BC Managed Care – PPO | Admitting: Gastroenterology

## 2022-12-12 ENCOUNTER — Encounter: Payer: Self-pay | Admitting: Gastroenterology

## 2022-12-12 VITALS — BP 90/60 | HR 64 | Ht 64.0 in | Wt 134.4 lb

## 2022-12-12 DIAGNOSIS — K219 Gastro-esophageal reflux disease without esophagitis: Secondary | ICD-10-CM | POA: Diagnosis not present

## 2022-12-12 DIAGNOSIS — K269 Duodenal ulcer, unspecified as acute or chronic, without hemorrhage or perforation: Secondary | ICD-10-CM | POA: Diagnosis not present

## 2022-12-12 DIAGNOSIS — R152 Fecal urgency: Secondary | ICD-10-CM

## 2022-12-12 DIAGNOSIS — R0989 Other specified symptoms and signs involving the circulatory and respiratory systems: Secondary | ICD-10-CM | POA: Diagnosis not present

## 2022-12-12 DIAGNOSIS — R194 Change in bowel habit: Secondary | ICD-10-CM

## 2022-12-12 MED ORDER — NA SULFATE-K SULFATE-MG SULF 17.5-3.13-1.6 GM/177ML PO SOLN: 1.0000 | ORAL | 0 refills | Status: DC

## 2022-12-12 NOTE — Progress Notes (Signed)
Chief Complaint:    GERD, duodenal ulcers  GI History: 32 year old male initially seen in the GI clinic in 09/2022 for evaluation of reflux.   - 11/18/2017: Patient seen by otolaryngology for ear pain.  Scribes some morning hoarseness.  He was on a twice a day PPI.  They did a transnasal laryngoscopy which showed some slight erythema to both vocal cords.  He was recommended to follow GI. - 05/2018: CT abdomen pelvis with contrast: small peripherally enhancing lesion in the prostate suspicious for prostatitis with small prostate ptotic abscess and a large stool burden. - 09/25/2022: Initial evaluation in [place] [facility] by Vicie Mutters for reflux.  Patient reports longstanding history of intermittent reflux symptoms with index symptoms throat clearing and hoarseness, but rare heartburn, regurgitation. Started pantoprazole 40 mg/day and famotidine 20 mg qhs, but still with throat clearing and hoarseness.  Ear pain and headaches tend to improve on therapy.  Separately, with changes in bowel habits described as episodic postprandial diarrhea.  Recommended fiber supplement along with EGD and continue pantoprazole/Pepcid. - 10/17/2022: EGD: Normal esophagus, stomach.  Hill grade 2 valve.  Multiple erosions in D2-D4 (path: Severe inflammatory changes).  Ordered fasting gastrin and CT enterography - 11/07/2022: Fasting gastrin normal - 12/02/2022: CT enterography: Normal GI tract.  Shotty subcentimeter lymph nodes in the small bowel mesentery without significant change from previous CT in 2019.  No pathologically enlarged lymph nodes.  No vascular pathology.  HPI:     Patient is a 32 y.o. male presenting to the Gastroenterology Clinic for follow-up.  Last seen in the office 11/12/2022.  Further evaluation for gastrinoma otherwise negative/normal as above.  Still with some throat clearing, otherwise feels improved with the pantoprazole. Described as "tolerable".   Still with variable bowel habits that are  related to PO intake.  Postprandial fecal urgency.  No nocturnal stools.  Symptoms are quite bothersome and affect daily life.  No hematochezia or melena.  Review of systems:     No chest pain, no SOB, no fevers, no urinary sx   Past Medical History:  Diagnosis Date   GERD (gastroesophageal reflux disease)    Thyroid disease     Patient's surgical history, family medical history, social history, medications and allergies were all reviewed in Epic    Current Outpatient Medications  Medication Sig Dispense Refill   doxycycline (VIBRAMYCIN) 100 MG capsule Take 100 mg by mouth 2 (two) times daily.     famotidine (PEPCID) 20 MG tablet Take 20 mg by mouth 2 (two) times daily.     levothyroxine (SYNTHROID, LEVOTHROID) 25 MCG tablet Take 25 mcg by mouth daily before breakfast.     pantoprazole (PROTONIX) 40 MG tablet Take 40 mg by mouth daily.     Current Facility-Administered Medications  Medication Dose Route Frequency Provider Last Rate Last Admin   0.9 %  sodium chloride infusion  500 mL Intravenous Once Izzie Geers V, DO        Physical Exam:     BP 90/60   Pulse 64   Ht '5\' 4"'$  (1.626 m)   Wt 134 lb 6.4 oz (61 kg)   BMI 23.07 kg/m   GENERAL:  Pleasant male in NAD PSYCH: : Cooperative, normal affect NEURO: Alert and oriented x 3, no focal neurologic deficits   IMPRESSION and PLAN:    1) Change in bowel habits, diarrhea 2) Fecal urgency Continues to have LGI symptoms which are quite bothersome to him.  CT enterography was largely unrevealing.  Based on duodenal ulcers, still possibility of IBD. - Colonoscopy with random and directed biopsies - Start low FODMAP diet.  Discussed diet with him today and provided with handout and detailed instructions - Increase dietary fiber and add fiber supplement as needed  3) Throat clearing 4) GERD without esophagitis - Symptoms improving with pantoprazole.  Will continue to monitor - If incomplete response or return of index  symptoms, may consider pH/impedance testing  5) Duodenal ulcers - Evaluation was negative for gastrinoma and CT enterography was otherwise unremarkable - Completed high-dose PPI and now on Protonix 40 mg daily  The indications, risks, and benefits of colonoscopy were explained to the patient in detail. Risks include but are not limited to bleeding, perforation, adverse reaction to medications, and cardiopulmonary compromise. Sequelae include but are not limited to the possibility of surgery, hospitalization, and mortality. The patient verbalized understanding and wished to proceed. All questions answered, referred to the scheduler and bowel prep ordered. Further recommendations pending results of the exam.            Lavena Bullion ,DO, FACG 12/12/2022, 9:57 AM

## 2022-12-12 NOTE — Patient Instructions (Addendum)
You have been scheduled for a colonoscopy. Please follow written instructions given to you at your visit today.  Please pick up your prep supplies at the pharmacy within the next 1-3 days. If you use inhalers (even only as needed), please bring them with you on the day of your procedure.  We have sent the following medications to your pharmacy for you to pick up at your convenience: Suprep   _______________________________________________________  If your blood pressure at your visit was 140/90 or greater, please contact your primary care physician to follow up on this.  _______________________________________________________  If you are age 28 or older, your body mass index should be between 23-30. Your Body mass index is 23.07 kg/m. If this is out of the aforementioned range listed, please consider follow up with your Primary Care Provider.  If you are age 86 or younger, your body mass index should be between 19-25. Your Body mass index is 23.07 kg/m. If this is out of the aformentioned range listed, please consider follow up with your Primary Care Provider.   ________________________________________________________  The [place] [facility] providers would like to encourage you to use [facility] to communicate with providers for non-urgent requests or questions.  Due to long hold times on the telephone, sending your provider a message by [facility] may be a faster and more efficient way to get a response.  Please allow 48 business hours for a response.  Please remember that this is for non-urgent requests.  _______________________________________________________   Low FODMAP Diet: (Fermentable Oligosaccharides, Disaccharides, Monosaccharides, and Polyols) These are short chain carbohydrates and sugar alcohols that are poorly absorbed by the body, resulting in multiple abdominal symptoms, including changes in bowel habits, abdominal pain/discomfort, bloating, abdominal distension, gas, etc.    \

## 2023-01-21 ENCOUNTER — Encounter: Payer: Self-pay | Admitting: Gastroenterology

## 2023-01-30 ENCOUNTER — Encounter: Payer: BC Managed Care – PPO | Admitting: Gastroenterology

## 2023-03-06 ENCOUNTER — Encounter: Payer: BC Managed Care – PPO | Admitting: Gastroenterology

## 2023-03-18 DIAGNOSIS — L218 Other seborrheic dermatitis: Secondary | ICD-10-CM | POA: Diagnosis not present

## 2023-03-18 DIAGNOSIS — L700 Acne vulgaris: Secondary | ICD-10-CM | POA: Diagnosis not present

## 2023-03-23 ENCOUNTER — Emergency Department (HOSPITAL_COMMUNITY): Payer: BC Managed Care – PPO

## 2023-03-23 ENCOUNTER — Emergency Department (HOSPITAL_COMMUNITY)
Admission: EM | Admit: 2023-03-23 | Discharge: 2023-03-23 | Disposition: A | Discharge status: Home / Self Care | Payer: BC Managed Care – PPO | Attending: Emergency Medicine | Admitting: Emergency Medicine

## 2023-03-23 ENCOUNTER — Other Ambulatory Visit: Payer: Self-pay

## 2023-03-23 DIAGNOSIS — R000 Tachycardia, unspecified: Secondary | ICD-10-CM | POA: Insufficient documentation

## 2023-03-23 DIAGNOSIS — H9201 Otalgia, right ear: Secondary | ICD-10-CM | POA: Insufficient documentation

## 2023-03-23 DIAGNOSIS — R079 Chest pain, unspecified: Secondary | ICD-10-CM | POA: Diagnosis not present

## 2023-03-23 DIAGNOSIS — L539 Erythematous condition, unspecified: Secondary | ICD-10-CM | POA: Insufficient documentation

## 2023-03-23 DIAGNOSIS — K0889 Other specified disorders of teeth and supporting structures: Secondary | ICD-10-CM | POA: Insufficient documentation

## 2023-03-23 DIAGNOSIS — Z79899 Other long term (current) drug therapy: Secondary | ICD-10-CM | POA: Insufficient documentation

## 2023-03-23 DIAGNOSIS — K219 Gastro-esophageal reflux disease without esophagitis: Secondary | ICD-10-CM | POA: Insufficient documentation

## 2023-03-23 MED ORDER — OXYCODONE HCL 5 MG PO TABS
5.0000 mg | ORAL_TABLET | Freq: Once | ORAL | Status: AC
  Administered 2023-03-23: 5 mg via ORAL
  Filled 2023-03-23: qty 1

## 2023-03-23 MED ORDER — AMOXICILLIN 500 MG PO CAPS: 500.0000 mg | ORAL_CAPSULE | Freq: Three times a day (TID) | ORAL | 0 refills | Status: DC

## 2023-03-23 MED ORDER — KETOROLAC TROMETHAMINE 60 MG/2ML IM SOLN
60.0000 mg | Freq: Once | INTRAMUSCULAR | Status: AC
  Administered 2023-03-23: 60 mg via INTRAMUSCULAR
  Filled 2023-03-23: qty 2

## 2023-03-23 NOTE — ED Triage Notes (Signed)
Patient coming to ED for evaluation of ear pain.  Reports pain started yesterday morning.  States "I can't tell if it is coming from my tooth or what."  Has been trying OTC medications without relief.  No reports of fever

## 2023-03-23 NOTE — ED Provider Notes (Signed)
[place] EMERGENCY DEPARTMENT AT [facility] Provider Note   CSN: [id number] Arrival date & time: 03/23/23  0041     History  Chief Complaint  Patient presents with   Otalgia    Craig Price is a 32 y.o. male.  The history is provided by the patient.  Otalgia Craig Price is a 32 y.o. male who presents to the Emergency Department complaining of earache.  He presents to the emergency department for evaluation of severe right-sided ear pain that started on Saturday.  Pain radiates to his jaw and right neck.  Pain is constant in nature.  He has experienced intermittent problems with his right lower molar for several months and has some sensitivity.  He did see a dentist for the dental sensitivity and was told his tooth was fine.  No reported fevers, nausea, vomiting, chest pain, difficulty breathing, numbness, weakness.  He does have a history of reflux and states that he has experienced intermittent reflux lately.  He does take omeprazole for that.   For the last 24 hours cold seems to be the only thing that makes his face feel better.    Home Medications Prior to Admission medications   Medication Sig Start Date End Date Taking? Authorizing Provider  amoxicillin (AMOXIL) 500 MG capsule Take 1 capsule (500 mg total) by mouth 3 (three) times daily. 03/23/23  Yes Tilden Fossa, MD  doxycycline (VIBRAMYCIN) 100 MG capsule Take 100 mg by mouth 2 (two) times daily. 09/11/22   [provider]  famotidine (PEPCID) 20 MG tablet Take 20 mg by mouth 2 (two) times daily.    [provider]  levothyroxine (SYNTHROID, LEVOTHROID) 25 MCG tablet Take 25 mcg by mouth daily before breakfast.    [provider]  Na Sulfate-K Sulfate-Mg Sulf (SUPREP BOWEL PREP KIT) 17.5-3.13-1.6 GM/177ML SOLN Take 1 kit by mouth as directed. For colonoscopy prep 12/12/22   Cirigliano, Vito V, DO  pantoprazole (PROTONIX) 40 MG tablet Take 40 mg by mouth daily.    [provider]      Allergies    Patient has no known allergies.    Review of Systems   Review of Systems  HENT:  Positive for ear pain.   All other systems reviewed and are negative.   Physical Exam Updated Vital Signs BP (!) 156/101 (BP Location: Right Arm)   Pulse 74   Temp 97.9 F (36.6 C) (Oral)   Resp 18   Ht 5\' 4"  (1.626 m)   Wt 60.8 kg   SpO2 100%   BMI 23.00 kg/m  Physical Exam Vitals and nursing note reviewed.  Constitutional:      Appearance: He is well-developed.  HENT:     Head: Normocephalic and atraumatic.     Comments: TMs without any erythema.  No mastoid tenderness.  There is mild erythema over the mucosa adjacent to tooth 32.  No erythema or edema in the posterior oropharynx.  There is mild mandibular tenderness to palpation in this region. Cardiovascular:     Rate and Rhythm: Normal rate and regular rhythm.     Heart sounds: No murmur heard. Pulmonary:     Effort: Pulmonary effort is normal. No respiratory distress.     Breath sounds: Normal breath sounds.  Abdominal:     Palpations: Abdomen is soft.     Tenderness: There is no abdominal tenderness. There is no guarding or rebound.  Musculoskeletal:        General: No tenderness.  Cervical back: Neck supple.     Comments: 2+ radial and DP pulses bilaterally  Lymphadenopathy:     Cervical: No cervical adenopathy.  Skin:    General: Skin is warm and dry.  Neurological:     Mental Status: He is alert and oriented to person, place, and time.     Comments: 5 out of 5 strength in all 4 extremities with sensation to light touch intact in all 4 extremities.  Normal gait.  Psychiatric:        Behavior: Behavior normal.     ED Results / Procedures / Treatments   Labs (all labs ordered are listed, but only abnormal results are displayed) Labs Reviewed - No data to display  EKG EKG Interpretation  Date/Time:  Monday March 23 2023 02:00:47 EDT Ventricular Rate:  106 PR Interval:  128 QRS  Duration: 88 QT Interval:  332 QTC Calculation: 441 R Axis:   73 Text Interpretation: Sinus tachycardia Nonspecific T wave abnormality Abnormal ECG No previous ECGs available Confirmed by Tilden Fossa [phone]) on 03/23/2023 2:02:44 AM  Radiology DG Chest 2 View  Result Date: 03/23/2023 CLINICAL DATA:  Chest pain EXAM: CHEST - 2 VIEW COMPARISON:  None Available. FINDINGS: The heart size and mediastinal contours are within normal limits. Both lungs are clear. The visualized skeletal structures are unremarkable. IMPRESSION: No active cardiopulmonary disease. Electronically Signed   By: Minerva Fester M.D.   On: 03/23/2023 01:58    Procedures Procedures    Medications Ordered in ED Medications  ketorolac (TORADOL) injection 60 mg (60 mg Intramuscular Given 03/23/23 0229)  oxyCODONE (Oxy IR/ROXICODONE) immediate release tablet 5 mg (5 mg Oral Given 03/23/23 0228)    ED Course/ Medical Decision Making/ A&P                             Medical Decision Making Amount and/or Complexity of Data Reviewed Radiology: ordered.  Risk Prescription drug management.   Patient here for evaluation of right-sided ear and facial pain.  He is nontoxic-appearing on evaluation and in no acute distress.  Ear examination is benign and not consistent with acute otitis media, mastoiditis or acute otitis externa.  He does have some erythema around his right lower molar without significant edema in this area.  He does have tenderness over his jaw on this region as well.  Question early developing dental abscess.  Current clinical picture is not consistent with dissection, ACS.  Patient treated with ketorolac, oxycodone for pain.  Will start antibiotics for possible early dental abscess.  Discussed outpatient follow-up with his dentist as well as return precautions.        Final Clinical Impression(s) / ED Diagnoses Final diagnoses:  Pain, dental  Right ear pain    Rx / DC Orders ED Discharge Orders           Ordered    amoxicillin (AMOXIL) 500 MG capsule  3 times daily        03/23/23 0216              Tilden Fossa, MD 03/23/23 [phone]

## 2023-04-17 ENCOUNTER — Encounter: Payer: BC Managed Care – PPO | Admitting: Gastroenterology

## 2023-05-12 ENCOUNTER — Ambulatory Visit (AMBULATORY_SURGERY_CENTER): Payer: BC Managed Care – PPO

## 2023-05-12 ENCOUNTER — Telehealth: Payer: Self-pay

## 2023-05-12 ENCOUNTER — Encounter: Payer: Self-pay | Admitting: Gastroenterology

## 2023-05-12 VITALS — Ht 64.0 in | Wt 138.0 lb

## 2023-05-12 DIAGNOSIS — R194 Change in bowel habit: Secondary | ICD-10-CM

## 2023-05-12 DIAGNOSIS — R152 Fecal urgency: Secondary | ICD-10-CM

## 2023-05-12 NOTE — Telephone Encounter (Signed)
PV in progress  

## 2023-05-12 NOTE — Progress Notes (Signed)
No egg or soy allergy known to patient  No issues known to pt with past sedation with any surgeries or procedures Patient denies ever being told they had issues or difficulty with intubation  No FH of Malignant Hyperthermia Pt is not on diet pills Pt is not on  home 02  Pt is not on blood thinners  Pt denies issues with constipation  No A fib or A flutter Have any cardiac testing pending--no LOA: independent  Prep: suprep   Patient's chart reviewed by Cathlyn Parsons CNRA prior to previsit and patient appropriate for the [facility].  Previsit completed and red dot placed by patient's name on their procedure day (on provider's schedule).     PV competed with patient. Prep instructions sent via mychart and home address. Pt has surprep from [facility] 12/12/22

## 2023-05-24 ENCOUNTER — Encounter: Payer: Self-pay | Admitting: Certified Registered Nurse Anesthetist

## 2023-05-28 ENCOUNTER — Ambulatory Visit: Payer: BC Managed Care – PPO | Admitting: Gastroenterology

## 2023-05-28 ENCOUNTER — Encounter: Payer: Self-pay | Admitting: Gastroenterology

## 2023-05-28 VITALS — BP 122/72 | HR 77 | Temp 98.0°F | Resp 14 | Ht 64.0 in | Wt 138.0 lb

## 2023-05-28 DIAGNOSIS — R194 Change in bowel habit: Secondary | ICD-10-CM

## 2023-05-28 DIAGNOSIS — R152 Fecal urgency: Secondary | ICD-10-CM

## 2023-05-28 MED ORDER — SODIUM CHLORIDE 0.9 % IV SOLN
500.0000 mL | Freq: Once | INTRAVENOUS | Status: DC
Start: 2023-05-28 — End: 2023-05-28

## 2023-05-28 NOTE — Op Note (Signed)
[facility] Patient Name: Craig Price Procedure Date: 05/28/2023 2:59 PM MRN: [id number] Endoscopist: Doristine Locks , MD, [id number] Age: 32 Referring MD:  Date of Birth: Apr 04, 1991 Gender: Male Account #: [id number] Procedure:                Colonoscopy Indications:              Change in bowel habits, Diarrhea, Fecal urgency Medicines:                Monitored Anesthesia Care Procedure:                Pre-Anesthesia Assessment:                           - Prior to the procedure, a History and Physical                            was performed, and patient medications and                            allergies were reviewed. The patient's tolerance of                            previous anesthesia was also reviewed. The risks                            and benefits of the procedure and the sedation                            options and risks were discussed with the patient.                            All questions were answered, and informed consent                            was obtained. Prior Anticoagulants: The patient has                            taken no anticoagulant or antiplatelet agents. ASA                            Grade Assessment: II - A patient with mild systemic                            disease. After reviewing the risks and benefits,                            the patient was deemed in satisfactory condition to                            undergo the procedure.                           After obtaining informed consent, the colonoscope  was passed under direct vision. Throughout the                            procedure, the patient's blood pressure, pulse, and                            oxygen saturations were monitored continuously. The                            Olympus CF-HQ190L ([id number]) Colonoscope was                            introduced through the anus and advanced to the 10                            cm into the  ileum. The colonoscopy was performed                            without difficulty. The patient tolerated the                            procedure well. The quality of the bowel                            preparation was excellent. The terminal ileum,                            ileocecal valve, appendiceal orifice, and rectum                            were photographed. Scope In: 3:14:31 PM Scope Out: 3:27:13 PM Scope Withdrawal Time: 0 hours 11 minutes 3 seconds  Total Procedure Duration: 0 hours 12 minutes 42 seconds  Findings:                 The perianal and digital rectal examinations were                            normal.                           The entire colon appeared normal. Biopsies for                            histology were taken with a cold forceps from the                            right colon and left colon for evaluation of                            microscopic colitis. Estimated blood loss was                            minimal.  The retroflexed view of the distal rectum and anal                            verge was normal and showed no anal or rectal                            abnormalities.                           The terminal ileum appeared normal. Complications:            No immediate complications. Estimated Blood Loss:     Estimated blood loss was minimal. Impression:               - The entire examined colon is normal. Biopsied.                           - The distal rectum and anal verge are normal on                            retroflexion view.                           - The examined portion of the ileum was normal. Recommendation:           - Patient has a contact number available for                            emergencies. The signs and symptoms of potential                            delayed complications were discussed with the                            patient. Return to normal activities tomorrow.                             Written discharge instructions were provided to the                            patient.                           - Resume previous diet.                           - Continue present medications.                           - Await pathology results.                           - Repeat colonoscopy at age 69 for screening                            purposes.                           -  Return to GI office PRN.                           - Use fiber, for example Citrucel, Fibercon, Konsyl                            or Metamucil. Doristine Locks, MD 05/28/2023 3:31:01 PM

## 2023-05-28 NOTE — Progress Notes (Signed)
GASTROENTEROLOGY PROCEDURE H&P NOTE   Primary Care Physician: Alysia Penna, MD    Reason for Procedure:  Change in bowel habits, fecal urgency, rule out IBD  Plan:    Colonoscopy  Patient is appropriate for endoscopic procedure(s) in the ambulatory ([facility]) setting.  The nature of the procedure, as well as the risks, benefits, and alternatives were carefully and thoroughly reviewed with the patient. Ample time for discussion and questions allowed. The patient understood, was satisfied, and agreed to proceed.     HPI: Craig Price is a 32 y.o. male who presents for colonoscopy for evaluation of change in bowel habits, fecal urgency, and rule out IBD.  EGD earlier this year with multiple erosions in the 2nd through 4th portion of the duodenum.  CT enterography was unremarkable.  Gastrin normal.  Colonoscopy to rule out IBD.  Past Medical History:  Diagnosis Date   GERD (gastroesophageal reflux disease)    Thyroid disease     Past Surgical History:  Procedure Laterality Date   ESOPHAGOGASTRODUODENOSCOPY      Prior to Admission medications   Medication Sig Start Date End Date Taking? Authorizing Provider  Cholecalciferol (VITAMIN D3) 1.25 MG (50000 UT) CAPS Take 1 capsule by mouth once a week. 04/14/23  Yes [provider]  doxycycline (VIBRAMYCIN) 100 MG capsule Take 100 mg by mouth 2 (two) times daily. 09/11/22  Yes [provider]  famotidine (PEPCID) 20 MG tablet Take 20 mg by mouth 2 (two) times daily.   Yes [provider]  levothyroxine (SYNTHROID) 50 MCG tablet Take 50 mcg by mouth every morning. 04/14/23  Yes [provider]  metroNIDAZOLE (METROGEL) 0.75 % gel Apply topically daily. 05/19/23  Yes [provider]  pantoprazole (PROTONIX) 40 MG tablet Take 40 mg by mouth daily.   Yes [provider]    Current Outpatient Medications  Medication Sig Dispense Refill   Cholecalciferol (VITAMIN D3) 1.25 MG (50000 UT)  CAPS Take 1 capsule by mouth once a week.     doxycycline (VIBRAMYCIN) 100 MG capsule Take 100 mg by mouth 2 (two) times daily.     famotidine (PEPCID) 20 MG tablet Take 20 mg by mouth 2 (two) times daily.     levothyroxine (SYNTHROID) 50 MCG tablet Take 50 mcg by mouth every morning.     metroNIDAZOLE (METROGEL) 0.75 % gel Apply topically daily.     pantoprazole (PROTONIX) 40 MG tablet Take 40 mg by mouth daily.     Current Facility-Administered Medications  Medication Dose Route Frequency Provider Last Rate Last Admin   0.9 %  sodium chloride infusion  500 mL Intravenous Once Deantre Bourdon V, DO        Allergies as of 05/28/2023   (No Known Allergies)    Family History  Problem Relation Age of Onset   Hypertension Mother    Diabetes type II Father    Colon cancer Neg Hx    Esophageal cancer Neg Hx    Rectal cancer Neg Hx    Stomach cancer Neg Hx     Social History   Socioeconomic History   Marital status: Single    Spouse name: Not on file   Number of children: 0   Years of education: Not on file   Highest education level: Not on file  Occupational History   Occupation: call center supervior  Tobacco Use   Smoking status: Never   Smokeless tobacco: Never  Vaping Use   Vaping status: Never Used  Substance and Sexual Activity   Alcohol use: Yes    Types: 1 drink(s) per week    Comment: only goes out about once a month   Drug use: No   Sexual activity: Yes    Partners: Female    Birth control/protection: Condom  Other Topics Concern   Not on file  Social History Narrative   Not on file   Social Determinants of Health   Financial Resource Strain: Not on file  Food Insecurity: Not on file  Transportation Needs: Not on file  Physical Activity: Not on file  Stress: Not on file  Social Connections: Not on file  Intimate Partner Violence: Not on file    Physical Exam: Vital signs in last 24 hours: @BP  (!) 109/50   Pulse 81   Temp 98 F (36.7 C)   Ht  5\' 4"  (1.626 m)   Wt 138 lb (62.6 kg)   SpO2 96%   BMI 23.69 kg/m  GEN: NAD EYE: Sclerae anicteric ENT: MMM CV: Non-tachycardic Pulm: CTA b/l GI: Soft, NT/ND NEURO:  Alert & Oriented x 3   Doristine Locks, DO  Gastroenterology   05/28/2023 3:02 PM

## 2023-05-28 NOTE — Progress Notes (Signed)
Called to room to assist during endoscopic procedure.  Patient ID and intended procedure confirmed with present staff. Received instructions for my participation in the procedure from the performing physician.  

## 2023-05-28 NOTE — Patient Instructions (Signed)
-  await pathology results -repeat colonoscopy at age 32 for surveillance recommended -Continue present medications  -Use over the counter fiber of choice   YOU HAD AN ENDOSCOPIC PROCEDURE TODAY AT THE [facility]:   Refer to the procedure report that was given to you for any specific questions about what was found during the examination.  If the procedure report does not answer your questions, please call your gastroenterologist to clarify.  If you requested that your care partner not be given the details of your procedure findings, then the procedure report has been included in a sealed envelope for you to review at your convenience later.  YOU SHOULD EXPECT: Some feelings of bloating in the abdomen. Passage of more gas than usual.  Walking can help get rid of the air that was put into your GI tract during the procedure and reduce the bloating. If you had a lower endoscopy (such as a colonoscopy or flexible sigmoidoscopy) you may notice spotting of blood in your stool or on the toilet paper. If you underwent a bowel prep for your procedure, you may not have a normal bowel movement for a few days.  Please Note:  You might notice some irritation and congestion in your nose or some drainage.  This is from the oxygen used during your procedure.  There is no need for concern and it should clear up in a day or so.  SYMPTOMS TO REPORT IMMEDIATELY:  Following lower endoscopy (colonoscopy or flexible sigmoidoscopy):  Excessive amounts of blood in the stool  Significant tenderness or worsening of abdominal pains  Swelling of the abdomen that is new, acute  Fever of 100F or higher   For urgent or emergent issues, a gastroenterologist can be reached at any hour by calling [phone]. Do not use MyChart messaging for urgent concerns.    DIET:  We do recommend a small meal at first, but then you may proceed to your regular diet.  Drink plenty of fluids but you should avoid alcoholic  beverages for 24 hours.  ACTIVITY:  You should plan to take it easy for the rest of today and you should NOT DRIVE or use heavy machinery until tomorrow (because of the sedation medicines used during the test).    FOLLOW UP: Our staff will call the number listed on your records the next business day following your procedure.  We will call around 7:15- 8:00 am to check on you and address any questions or concerns that you may have regarding the information given to you following your procedure. If we do not reach you, we will leave a message.     If any biopsies were taken you will be contacted by phone or by letter within the next 1-3 weeks.  Please call us at [phone] if you have not heard about the biopsies in 3 weeks.    SIGNATURES/CONFIDENTIALITY: You and/or your care partner have signed paperwork which will be entered into your electronic medical record.  These signatures attest to the fact that that the information above on your After Visit Summary has been reviewed and is understood.  Full responsibility of the confidentiality of this discharge information lies with you and/or your care-partner.

## 2023-05-28 NOTE — Progress Notes (Signed)
Report given to PACU, vss 

## 2023-05-29 ENCOUNTER — Telehealth: Payer: Self-pay

## 2023-05-29 NOTE — Telephone Encounter (Signed)
  Follow up Call-     05/28/2023    2:25 PM 10/17/2022    9:34 AM  Call back number  Post procedure Call Back phone  # [phone]  Permission to leave phone message Yes Yes     Patient questions:  Do you have a fever, pain , or abdominal swelling? No. Pain Score  0 *  Have you tolerated food without any problems? Yes  Have you been able to return to your normal activities? Yes.    Do you have any questions about your discharge instructions: Diet   No. Medications  No. Follow up visit  No.  Do you have questions or concerns about your Care? Yes.  Pt could not remember when he would receive results from the biopsies that were taken.  Informed pt he would receive either a letter or a phone call in approximately a week with biopsy results.  Actions: * If pain score is 4 or above: No action needed, pain <4.

## 2023-07-01 DIAGNOSIS — L0212 Furuncle of neck: Secondary | ICD-10-CM | POA: Diagnosis not present

## 2023-07-01 DIAGNOSIS — L218 Other seborrheic dermatitis: Secondary | ICD-10-CM | POA: Diagnosis not present

## 2023-07-01 DIAGNOSIS — L700 Acne vulgaris: Secondary | ICD-10-CM | POA: Diagnosis not present

## 2023-08-21 DIAGNOSIS — E099 Drug or chemical induced diabetes mellitus without complications: Secondary | ICD-10-CM | POA: Diagnosis not present

## 2023-08-21 DIAGNOSIS — Z0000 Encounter for general adult medical examination without abnormal findings: Secondary | ICD-10-CM | POA: Diagnosis not present

## 2023-08-21 DIAGNOSIS — Z0189 Encounter for other specified special examinations: Secondary | ICD-10-CM | POA: Diagnosis not present

## 2023-08-21 DIAGNOSIS — E559 Vitamin D deficiency, unspecified: Secondary | ICD-10-CM | POA: Diagnosis not present

## 2023-08-26 DIAGNOSIS — Z1331 Encounter for screening for depression: Secondary | ICD-10-CM | POA: Diagnosis not present

## 2023-08-26 DIAGNOSIS — Z1339 Encounter for screening examination for other mental health and behavioral disorders: Secondary | ICD-10-CM | POA: Diagnosis not present

## 2023-08-26 DIAGNOSIS — Z0000 Encounter for general adult medical examination without abnormal findings: Secondary | ICD-10-CM | POA: Diagnosis not present

## 2023-08-26 DIAGNOSIS — E039 Hypothyroidism, unspecified: Secondary | ICD-10-CM | POA: Diagnosis not present

## 2023-09-24 DIAGNOSIS — L700 Acne vulgaris: Secondary | ICD-10-CM | POA: Diagnosis not present

## 2023-09-24 DIAGNOSIS — L0212 Furuncle of neck: Secondary | ICD-10-CM | POA: Diagnosis not present

## 2024-01-12 DIAGNOSIS — L659 Nonscarring hair loss, unspecified: Secondary | ICD-10-CM | POA: Diagnosis not present

## 2024-01-12 DIAGNOSIS — L718 Other rosacea: Secondary | ICD-10-CM | POA: Diagnosis not present

## 2024-01-12 DIAGNOSIS — L700 Acne vulgaris: Secondary | ICD-10-CM | POA: Diagnosis not present

## 2024-01-12 DIAGNOSIS — L739 Follicular disorder, unspecified: Secondary | ICD-10-CM | POA: Diagnosis not present

## 2024-01-22 ENCOUNTER — Other Ambulatory Visit

## 2024-01-22 ENCOUNTER — Encounter: Payer: Self-pay | Admitting: Gastroenterology

## 2024-01-22 ENCOUNTER — Ambulatory Visit: Payer: BC Managed Care – PPO | Admitting: Gastroenterology

## 2024-01-22 VITALS — BP 130/68 | HR 80 | Ht 65.0 in | Wt 145.0 lb

## 2024-01-22 DIAGNOSIS — R0989 Other specified symptoms and signs involving the circulatory and respiratory systems: Secondary | ICD-10-CM | POA: Diagnosis not present

## 2024-01-22 DIAGNOSIS — K219 Gastro-esophageal reflux disease without esophagitis: Secondary | ICD-10-CM

## 2024-01-22 DIAGNOSIS — K589 Irritable bowel syndrome without diarrhea: Secondary | ICD-10-CM | POA: Diagnosis not present

## 2024-01-22 LAB — CBC WITH DIFFERENTIAL/PLATELET
Basophils Absolute: 0 10*3/uL (ref 0.0–0.1)
Basophils Relative: 0.4 % (ref 0.0–3.0)
Eosinophils Absolute: 0.1 10*3/uL (ref 0.0–0.7)
Eosinophils Relative: 1 % (ref 0.0–5.0)
HCT: 45.6 % (ref 39.0–52.0)
Hemoglobin: 15.5 g/dL (ref 13.0–17.0)
Lymphocytes Relative: 25.5 % (ref 12.0–46.0)
Lymphs Abs: 1.7 10*3/uL (ref 0.7–4.0)
MCHC: 34.1 g/dL (ref 30.0–36.0)
MCV: 89.6 fl (ref 78.0–100.0)
Monocytes Absolute: 0.7 10*3/uL (ref 0.1–1.0)
Monocytes Relative: 9.8 % (ref 3.0–12.0)
Neutro Abs: 4.3 10*3/uL (ref 1.4–7.7)
Neutrophils Relative %: 63.3 % (ref 43.0–77.0)
Platelets: 297 10*3/uL (ref 150.0–400.0)
RBC: 5.09 Mil/uL (ref 4.22–5.81)
RDW: 12.8 % (ref 11.5–15.5)
WBC: 6.8 10*3/uL (ref 4.0–10.5)

## 2024-01-22 LAB — IBC + FERRITIN
Ferritin: 46.8 ng/mL (ref 22.0–322.0)
Iron: 110 ug/dL (ref 42–165)
Saturation Ratios: 32.7 % (ref 20.0–50.0)
TIBC: 336 ug/dL (ref 250.0–450.0)
Transferrin: 240 mg/dL (ref 212.0–360.0)

## 2024-01-22 LAB — COMPREHENSIVE METABOLIC PANEL WITH GFR
ALT: 18 U/L (ref 0–53)
AST: 16 U/L (ref 0–37)
Albumin: 4.7 g/dL (ref 3.5–5.2)
Alkaline Phosphatase: 98 U/L (ref 39–117)
BUN: 16 mg/dL (ref 6–23)
CO2: 28 meq/L (ref 19–32)
Calcium: 9.7 mg/dL (ref 8.4–10.5)
Chloride: 102 meq/L (ref 96–112)
Creatinine, Ser: 0.87 mg/dL (ref 0.40–1.50)
GFR: 113.28 mL/min (ref >60.00)
Glucose, Bld: 92 mg/dL (ref 70–99)
Potassium: 4.6 meq/L (ref 3.5–5.1)
Sodium: 138 meq/L (ref 135–145)
Total Bilirubin: 0.4 mg/dL (ref 0.2–1.2)
Total Protein: 7.2 g/dL (ref 6.0–8.3)

## 2024-01-22 LAB — B12 AND FOLATE PANEL
Folate: 17.8 ng/mL (ref >5.9)
Vitamin B-12: 248 pg/mL (ref 211–911)

## 2024-01-22 LAB — VITAMIN D 25 HYDROXY (VIT D DEFICIENCY, FRACTURES): VITD: 65.51 ng/mL (ref 30.00–100.00)

## 2024-01-22 NOTE — Progress Notes (Signed)
 Chief Complaint:    GERD, increased throat clearing  GI History: 33 year old male initially seen in the GI clinic in 09/2022 for evaluation of reflux.   - 11/18/2017: Patient seen by otolaryngology for ear pain.  Describes some morning hoarseness.  He was on a twice a day PPI.  They did a transnasal laryngoscopy which showed some slight erythema to both vocal cords.  He was recommended to follow GI. - 05/2018: CT abdomen pelvis with contrast: small peripherally enhancing lesion in the prostate suspicious for prostatitis with small prostate ptotic abscess and a large stool burden. - 09/25/2022: Initial evaluation in [facility] by Quentin Mulling for reflux.  Patient reports longstanding history of intermittent reflux symptoms with index symptoms throat clearing and hoarseness, but rare heartburn, regurgitation. Started pantoprazole 40 mg/day and famotidine 20 mg qhs, but still with throat clearing and hoarseness.  Ear pain and headaches tend to improve on therapy.  Separately, with changes in bowel habits described as episodic postprandial diarrhea.  Recommended fiber supplement along with EGD and continue pantoprazole/Pepcid. - 10/17/2022: EGD: Normal esophagus, stomach.  Hill grade 2 valve.  Multiple erosions in D2-D4 (path: Severe inflammatory changes).  - 11/07/2022: Fasting gastrin normal - 12/02/2022: CT enterography: Normal GI tract.  Shotty subcentimeter lymph nodes in the small bowel mesentery without significant change from previous CT in 2019.  No pathologically enlarged lymph nodes.  No vascular pathology. - 05/28/2023: Colonoscopy: Normal with biopsies negative for MC.  Normal TI.  Repeat age 77  HPI:     Patient is a 33 y.o. male presenting to the Gastroenterology Clinic for follow-up.  Was last seen in the GI clinic on 12/12/2018 for.  Still with some throat clearing at that time, but overall improved with pantoprazole.  Separately, was having postprandial fecal urgency and recommended  low FODMAP diet (never tried), increased dietary fiber with fiber supplement (never tried), and subsequently completed colonoscopy with biopsies as above.  Still with increased throat clearing, particularly post prandial. Currently taking Protonix 40 mg daily (takes at bedtime) and Pepcid QAM. Started Claritin, but not much improvement in throat clearing. No odynophagia or dysphagia.   LGI sxs have improved with dietaty modifications, going to the gym again.  He has not yet tried the low FODMAP diet or fiber supplement.  Otherwise, no new labs or abdominal imaging for review since last appointment.   Review of systems:     No chest pain, no SOB, no fevers, no urinary sx   Past Medical History:  Diagnosis Date   GERD (gastroesophageal reflux disease)    Thyroid disease     Patient's surgical history, family medical history, social history, medications and allergies were all reviewed in Epic    Current Outpatient Medications  Medication Sig Dispense Refill   Cholecalciferol (VITAMIN D3) 1.25 MG (50000 UT) CAPS Take 1 capsule by mouth once a week.     doxycycline (VIBRAMYCIN) 100 MG capsule Take 100 mg by mouth 2 (two) times daily.     famotidine (PEPCID) 20 MG tablet Take 20 mg by mouth 2 (two) times daily.     levothyroxine (SYNTHROID) 50 MCG tablet Take 50 mcg by mouth every morning.     metroNIDAZOLE (METROGEL) 0.75 % gel Apply topically daily.     pantoprazole (PROTONIX) 40 MG tablet Take 40 mg by mouth daily.     No current facility-administered medications for this visit.    Physical Exam:     BP 130/68   Pulse 80  Ht 5\' 5"  (1.651 m)   Wt 145 lb (65.8 kg)   BMI 24.13 kg/m   GENERAL:  Pleasant male in NAD PSYCH: : Cooperative, normal affect Musculoskeletal:  Normal muscle tone, normal strength NEURO: Alert and oriented x 3, no focal neurologic deficits   IMPRESSION and PLAN:    1) GERD 2) Increased throat clearing - Change Protonix to pre-dinner engage for  improved clinical response - Ok to resume Pepcid - If ongoing sxs, can consider EM and pH/Mii.  Would plan to be done ON PPI to evaluate for breakthrough symptoms despite acid suppression therapy - Check BMP, CBC, folate, B12, iron panel, and Vitamin D while on chronic PPI therapy - Can potentially start to titrate Protonix down if sxs well controlled   3) IBS - Low FODMAP diet - Benefiber or Citrucel    RTC in 1 year or sooner prn  I spent 30 minutes of time, including in depth chart review, independent review of results as outlined above, communicating results with the patient directly, face-to-face time with the patient, coordinating care, and ordering studies and medications as appropriate, and documentation.           Verlin Dike Daniil Labarge ,DO, FACG 01/22/2024, 1:30 PM

## 2024-01-22 NOTE — Patient Instructions (Addendum)
 Your provider has requested that you go to the basement level for lab work before leaving today. Press "B" on the elevator. The lab is located at the first door on the left as you exit the elevator.   Please purchase the following medications over the counter and take as directed: Start Benefiber daily.   Low-FODMAP Eating Plan  FODMAP stands for fermentable oligosaccharides, disaccharides, monosaccharides, and polyols. These are sugars that are hard for some people to digest. A low-FODMAP eating plan may help some people who have irritable bowel syndrome (IBS) and certain other bowel (intestinal) diseases to manage their symptoms. This meal plan can be complicated to follow. Work with a diet and nutrition specialist (dietitian) to make a low-FODMAP eating plan that is right for you. A dietitian can help make sure that you get enough nutrition from this diet. What are tips for following this plan? Reading food labels Check labels for hidden FODMAPs such as: High-fructose syrup. Honey. Agave. Natural fruit flavors. Onion or garlic powder. Choose low-FODMAP foods that contain 3-4 grams of fiber per serving. Check food labels for serving sizes. Eat only one serving at a time to make sure FODMAP levels stay low. Shopping Shop with a list of foods that are recommended on this diet and make a meal plan. Meal planning Follow a low-FODMAP eating plan for up to 6 weeks, or as told by your health care provider or dietitian. To follow the eating plan: Eliminate high-FODMAP foods from your diet completely. Choose only low-FODMAP foods to eat. You will do this for 2-6 weeks. Gradually reintroduce high-FODMAP foods into your diet one at a time. Most people should wait a few days before introducing the next new high-FODMAP food into their meal plan. Your dietitian can recommend how quickly you may reintroduce foods. Keep a daily record of what and how much you eat and drink. Make note of any symptoms that  you have after eating. Review your daily record with a dietitian regularly to identify which foods you can eat and which foods you should avoid. General tips Drink enough fluid each day to keep your urine pale yellow. Avoid processed foods. These often have added sugar and may be high in FODMAPs. Avoid most dairy products, whole grains, and sweeteners. Work with a dietitian to make sure you get enough fiber in your diet. Avoid high FODMAP foods at meals to manage symptoms. Recommended foods Fruits Bananas, oranges, tangerines, lemons, limes, blueberries, raspberries, strawberries, grapes, cantaloupe, honeydew melon, kiwi, papaya, passion fruit, and pineapple. Limited amounts of dried cranberries, banana chips, and shredded coconut. Vegetables Eggplant, zucchini, cucumber, peppers, green beans, bean sprouts, lettuce, arugula, kale, Swiss chard, spinach, collard greens, bok choy, summer squash, potato, and tomato. Limited amounts of corn, carrot, and sweet potato. Green parts of scallions. Grains Gluten-free grains, such as rice, oats, buckwheat, quinoa, corn, polenta, and millet. Gluten-free pasta, bread, or cereal. Rice noodles. Corn tortillas. Meats and other proteins Unseasoned beef, pork, poultry, or fish. Eggs. Tomasa Blase. Tofu (firm) and tempeh. Limited amounts of nuts and seeds, such as almonds, walnuts, [place] nuts, pecans, peanuts, nut butters, pumpkin seeds, chia seeds, and sunflower seeds. Dairy Lactose-free milk, yogurt, and kefir. Lactose-free cottage cheese and ice cream. Non-dairy milks, such as almond, coconut, hemp, and rice milk. Non-dairy yogurt. Limited amounts of goat cheese, brie, mozzarella, parmesan, swiss, and other hard cheeses. Fats and oils Butter-free spreads. Vegetable oils, such as olive, canola, and sunflower oil. Seasoning and other foods Artificial sweeteners with names that  do not end in "ol," such as aspartame, saccharine, and stevia. Maple syrup, white table  sugar, raw sugar, brown sugar, and molasses. Mayonnaise, soy sauce, and tamari. Fresh basil, coriander, parsley, rosemary, and thyme. Beverages Water and mineral water. Sugar-sweetened soft drinks. Small amounts of orange juice or cranberry juice. Black and green tea. Most dry wines. Coffee. The items listed above may not be a complete list of foods and beverages you can eat. Contact a dietitian for more information. Foods to avoid Fruits Fresh, dried, and juiced forms of apple, pear, watermelon, peach, plum, cherries, apricots, blackberries, boysenberries, figs, nectarines, and mango. Avocado. Vegetables Chicory root, artichoke, asparagus, cabbage, snow peas, Brussels sprouts, broccoli, sugar snap peas, mushrooms, celery, and cauliflower. Onions, garlic, leeks, and the white part of scallions. Grains Wheat, including kamut, durum, and semolina. Barley and bulgur. Couscous. Wheat-based cereals. Wheat noodles, bread, crackers, and pastries. Meats and other proteins Fried or fatty meat. Sausage. Cashews and pistachios. Soybeans, baked beans, black beans, chickpeas, kidney beans, fava beans, navy beans, lentils, black-eyed peas, and split peas. Dairy Milk, yogurt, ice cream, and soft cheese. Cream and sour cream. Milk-based sauces. Custard. Buttermilk. Soy milk. Seasoning and other foods Any sugar-free gum or candy. Foods that contain artificial sweeteners such as sorbitol, mannitol, isomalt, or xylitol. Foods that contain honey, high-fructose corn syrup, or agave. Bouillon, vegetable stock, beef stock, and chicken stock. Garlic and onion powder. Condiments made with onion, such as hummus, chutney, pickles, relish, salad dressing, and salsa. Tomato paste. Beverages Chicory-based drinks. Coffee substitutes. Chamomile tea. Fennel tea. Sweet or fortified wines such as port or sherry. Diet soft drinks made with isomalt, mannitol, maltitol, sorbitol, or xylitol. Apple, pear, and mango juice. Juices with  high-fructose corn syrup. The items listed above may not be a complete list of foods and beverages you should avoid. Contact a dietitian for more information. Summary FODMAP stands for fermentable oligosaccharides, disaccharides, monosaccharides, and polyols. These are sugars that are hard for some people to digest. A low-FODMAP eating plan is a short-term diet that helps to ease symptoms of certain bowel diseases. The eating plan usually lasts up to 6 weeks. After that, high-FODMAP foods are reintroduced gradually and one at a time. This can help you find out which foods may be causing symptoms. A low-FODMAP eating plan can be complicated. It is best to work with a dietitian who has experience with this type of plan. This information is not intended to replace advice given to you by your health care provider. Make sure you discuss any questions you have with your health care provider. Document Revised: 02/16/2020 Document Reviewed: 02/16/2020 [employer] Patient Education  2024 [employer]  _______________________________________________________  If your blood pressure at your visit was 140/90 or greater, please contact your primary care physician to follow up on this.  _______________________________________________________  If you are age 40 or older, your body mass index should be between 23-30. Your Body mass index is 24.13 kg/m. If this is out of the aforementioned range listed, please consider follow up with your Primary Care Provider.  If you are age 33 or younger, your body mass index should be between 19-25. Your Body mass index is 24.13 kg/m. If this is out of the aformentioned range listed, please consider follow up with your Primary Care Provider.   ________________________________________________________  The [facility] providers would like to encourage you to use [facility] to communicate with providers for non-urgent requests or questions.  Due to long hold times on the  telephone, sending your provider a message by [facility] may be a faster and more efficient way to get a response.  Please allow 48 business hours for a response.  Please remember that this is for non-urgent requests.  _______________________________________________________

## 2024-04-13 DIAGNOSIS — L700 Acne vulgaris: Secondary | ICD-10-CM | POA: Diagnosis not present

## 2024-04-13 DIAGNOSIS — L718 Other rosacea: Secondary | ICD-10-CM | POA: Diagnosis not present

## 2024-04-13 DIAGNOSIS — L739 Follicular disorder, unspecified: Secondary | ICD-10-CM | POA: Diagnosis not present

## 2024-04-13 DIAGNOSIS — Z7189 Other specified counseling: Secondary | ICD-10-CM | POA: Diagnosis not present

## 2024-07-11 DIAGNOSIS — L709 Acne, unspecified: Secondary | ICD-10-CM | POA: Diagnosis not present

## 2024-08-26 DIAGNOSIS — E559 Vitamin D deficiency, unspecified: Secondary | ICD-10-CM | POA: Diagnosis not present

## 2024-08-26 DIAGNOSIS — E039 Hypothyroidism, unspecified: Secondary | ICD-10-CM | POA: Diagnosis not present

## 2024-08-30 ENCOUNTER — Ambulatory Visit: Admitting: Gastroenterology

## 2024-08-30 NOTE — Progress Notes (Deleted)
 SABRA

## 2024-09-02 DIAGNOSIS — Z1331 Encounter for screening for depression: Secondary | ICD-10-CM | POA: Diagnosis not present

## 2024-09-02 DIAGNOSIS — Z0000 Encounter for general adult medical examination without abnormal findings: Secondary | ICD-10-CM | POA: Diagnosis not present

## 2024-09-02 DIAGNOSIS — L709 Acne, unspecified: Secondary | ICD-10-CM | POA: Diagnosis not present

## 2024-09-02 DIAGNOSIS — Z1339 Encounter for screening examination for other mental health and behavioral disorders: Secondary | ICD-10-CM | POA: Diagnosis not present

## 2024-10-11 ENCOUNTER — Ambulatory Visit: Admitting: Gastroenterology

## 2024-10-11 NOTE — Progress Notes (Deleted)
 SABRA

## 2025-04-18 ENCOUNTER — Ambulatory Visit: Admitting: Physician Assistant
